# Patient Record
Sex: Male | Born: 1965 | Race: White | Hispanic: No | Marital: Single | Smoking: Current every day smoker
Health system: Southern US, Community
[De-identification: ages and names within clinical notes are randomized; demographics above are authoritative.]

## PROBLEM LIST (undated history)

## (undated) DIAGNOSIS — I10 Essential (primary) hypertension: Secondary | ICD-10-CM

## (undated) DIAGNOSIS — F329 Major depressive disorder, single episode, unspecified: Secondary | ICD-10-CM

## (undated) DIAGNOSIS — F32A Depression, unspecified: Secondary | ICD-10-CM

## (undated) DIAGNOSIS — F419 Anxiety disorder, unspecified: Secondary | ICD-10-CM

## (undated) DIAGNOSIS — Z86711 Personal history of pulmonary embolism: Secondary | ICD-10-CM

## (undated) DIAGNOSIS — M545 Low back pain, unspecified: Secondary | ICD-10-CM

## (undated) HISTORY — PX: TONSILLECTOMY: SUR1361

## (undated) HISTORY — PX: KNEE SURGERY: SHX244

---

## 1998-08-23 ENCOUNTER — Encounter: Payer: Self-pay | Admitting: Orthopedic Surgery

## 1998-08-23 ENCOUNTER — Inpatient Hospital Stay (HOSPITAL_COMMUNITY): Admission: AD | Admit: 1998-08-23 | Discharge: 1998-08-30 | Payer: Self-pay | Admitting: Orthopedic Surgery

## 1999-03-15 ENCOUNTER — Encounter: Payer: Self-pay | Admitting: Orthopedic Surgery

## 1999-03-15 ENCOUNTER — Encounter: Admission: RE | Admit: 1999-03-15 | Discharge: 1999-03-15 | Payer: Self-pay | Admitting: Orthopedic Surgery

## 2004-03-15 ENCOUNTER — Ambulatory Visit: Payer: Self-pay | Admitting: Internal Medicine

## 2004-05-28 ENCOUNTER — Ambulatory Visit: Payer: Self-pay | Admitting: Internal Medicine

## 2004-08-15 ENCOUNTER — Ambulatory Visit: Payer: Self-pay | Admitting: Internal Medicine

## 2004-10-17 ENCOUNTER — Ambulatory Visit: Payer: Self-pay | Admitting: Internal Medicine

## 2004-10-27 ENCOUNTER — Ambulatory Visit (HOSPITAL_COMMUNITY): Admission: RE | Admit: 2004-10-27 | Discharge: 2004-10-27 | Payer: Self-pay | Admitting: Neurosurgery

## 2004-11-15 ENCOUNTER — Ambulatory Visit: Payer: Self-pay | Admitting: Internal Medicine

## 2004-12-05 ENCOUNTER — Ambulatory Visit: Payer: Self-pay | Admitting: Physical Medicine and Rehabilitation

## 2004-12-05 ENCOUNTER — Encounter
Admission: RE | Admit: 2004-12-05 | Discharge: 2005-03-05 | Payer: Self-pay | Admitting: Physical Medicine and Rehabilitation

## 2004-12-26 ENCOUNTER — Emergency Department (HOSPITAL_COMMUNITY): Admission: EM | Admit: 2004-12-26 | Discharge: 2004-12-26 | Payer: Self-pay | Admitting: Emergency Medicine

## 2004-12-27 ENCOUNTER — Ambulatory Visit: Payer: Self-pay | Admitting: Psychiatry

## 2004-12-27 ENCOUNTER — Inpatient Hospital Stay (HOSPITAL_COMMUNITY): Admission: RE | Admit: 2004-12-27 | Discharge: 2004-12-31 | Payer: Self-pay | Admitting: Psychiatry

## 2005-01-01 ENCOUNTER — Ambulatory Visit (HOSPITAL_COMMUNITY): Payer: Self-pay | Admitting: Psychiatry

## 2005-01-08 ENCOUNTER — Ambulatory Visit (HOSPITAL_COMMUNITY): Payer: Self-pay | Admitting: Psychiatry

## 2005-01-20 ENCOUNTER — Ambulatory Visit (HOSPITAL_COMMUNITY): Payer: Self-pay | Admitting: Psychiatry

## 2010-05-23 DIAGNOSIS — J42 Unspecified chronic bronchitis: Secondary | ICD-10-CM | POA: Insufficient documentation

## 2010-05-23 DIAGNOSIS — IMO0001 Reserved for inherently not codable concepts without codable children: Secondary | ICD-10-CM

## 2010-05-23 DIAGNOSIS — M4316 Spondylolisthesis, lumbar region: Secondary | ICD-10-CM

## 2010-05-23 DIAGNOSIS — I10 Essential (primary) hypertension: Secondary | ICD-10-CM | POA: Insufficient documentation

## 2012-07-28 ENCOUNTER — Other Ambulatory Visit: Payer: Self-pay | Admitting: Family Medicine

## 2012-07-28 DIAGNOSIS — M47816 Spondylosis without myelopathy or radiculopathy, lumbar region: Secondary | ICD-10-CM

## 2012-07-28 DIAGNOSIS — H5122 Internuclear ophthalmoplegia, left eye: Secondary | ICD-10-CM

## 2012-07-28 DIAGNOSIS — IMO0002 Reserved for concepts with insufficient information to code with codable children: Secondary | ICD-10-CM

## 2012-08-04 ENCOUNTER — Other Ambulatory Visit: Payer: Self-pay

## 2012-08-05 ENCOUNTER — Ambulatory Visit
Admission: RE | Admit: 2012-08-05 | Discharge: 2012-08-05 | Disposition: A | Discharge status: Home / Self Care | Payer: BC Managed Care – PPO | Source: Ambulatory Visit | Attending: Family Medicine | Admitting: Family Medicine

## 2012-08-05 DIAGNOSIS — IMO0002 Reserved for concepts with insufficient information to code with codable children: Secondary | ICD-10-CM

## 2012-08-05 DIAGNOSIS — H5122 Internuclear ophthalmoplegia, left eye: Secondary | ICD-10-CM

## 2012-08-05 DIAGNOSIS — M47816 Spondylosis without myelopathy or radiculopathy, lumbar region: Secondary | ICD-10-CM

## 2012-10-22 ENCOUNTER — Other Ambulatory Visit: Payer: Self-pay | Admitting: Neurosurgery

## 2012-11-04 ENCOUNTER — Encounter (HOSPITAL_COMMUNITY): Payer: Self-pay

## 2012-11-04 ENCOUNTER — Ambulatory Visit (HOSPITAL_COMMUNITY)
Admission: RE | Admit: 2012-11-04 | Discharge: 2012-11-04 | Disposition: A | Discharge status: Home / Self Care | Payer: BC Managed Care – PPO | Source: Ambulatory Visit | Attending: Anesthesiology | Admitting: Anesthesiology

## 2012-11-04 ENCOUNTER — Encounter (HOSPITAL_COMMUNITY)
Admission: RE | Admit: 2012-11-04 | Discharge: 2012-11-04 | Disposition: A | Discharge status: Home / Self Care | Payer: BC Managed Care – PPO | Source: Ambulatory Visit | Attending: Neurosurgery | Admitting: Neurosurgery

## 2012-11-04 ENCOUNTER — Encounter (HOSPITAL_COMMUNITY): Payer: Self-pay | Admitting: Pharmacy Technician

## 2012-11-04 DIAGNOSIS — Z01818 Encounter for other preprocedural examination: Secondary | ICD-10-CM | POA: Insufficient documentation

## 2012-11-04 DIAGNOSIS — Z01812 Encounter for preprocedural laboratory examination: Secondary | ICD-10-CM | POA: Insufficient documentation

## 2012-11-04 DIAGNOSIS — R9431 Abnormal electrocardiogram [ECG] [EKG]: Secondary | ICD-10-CM | POA: Insufficient documentation

## 2012-11-04 DIAGNOSIS — Z01810 Encounter for preprocedural cardiovascular examination: Secondary | ICD-10-CM | POA: Insufficient documentation

## 2012-11-04 HISTORY — DX: Depression, unspecified: F32.A

## 2012-11-04 HISTORY — DX: Major depressive disorder, single episode, unspecified: F32.9

## 2012-11-04 HISTORY — DX: Anxiety disorder, unspecified: F41.9

## 2012-11-04 HISTORY — DX: Essential (primary) hypertension: I10

## 2012-11-04 HISTORY — DX: Low back pain, unspecified: M54.50

## 2012-11-04 HISTORY — DX: Low back pain: M54.5

## 2012-11-04 LAB — TYPE AND SCREEN: ABO/RH(D): O POS

## 2012-11-04 LAB — CBC
MCH: 33.2 pg (ref 26.0–34.0)
Platelets: 174 10*3/uL (ref 150–400)
RBC: 4.61 MIL/uL (ref 4.22–5.81)
WBC: 7.1 10*3/uL (ref 4.0–10.5)

## 2012-11-04 LAB — BASIC METABOLIC PANEL
Calcium: 9.4 mg/dL (ref 8.4–10.5)
GFR calc non Af Amer: >90 mL/min (ref >90)
Sodium: 141 mEq/L (ref 135–145)

## 2012-11-04 LAB — SURGICAL PCR SCREEN: MRSA, PCR: NEGATIVE

## 2012-11-04 NOTE — Progress Notes (Signed)
Anesthesia Chart Review:  Patient is a 47 year old male scheduled for L5-S1 PLIF on 11/11/12 by Dr. Lovell Sheehan.  History includes smoking, HTN, depression, anxiety, tonsillectomy, 4 knee surgeries.  PCP is listed as Dr. Aida Puffer.  EKG on 11/04/12 showed NSR with sinus arrhythmia, minimal voltage criteria for LVH, may be normal variant, non-specific T wave abnormality.    CXR on 11/04/12 showed no active cardiopulmonary disease.  Preoperative labs noted.  Preoperative diagnostic studies appear acceptable for OR.  Further evaluation by his assigned anesthesiologist on the day of surgery.  Anthony Berg [facility]/Anesthesiology Phone [phone] 11/04/2012 5:02 PM

## 2012-11-04 NOTE — Pre-Procedure Instructions (Signed)
SCOTTIE METAYER  11/04/2012   Your procedure is scheduled on:  Thursday, October 2  Report to [facility]  2 * 3 at 0630 AM.  Call this number if you have problems the morning of surgery: [phone]   Remember:   Do not eat food or drink liquids after midnight Wednesday night.   Take these medicines the morning of surgery with A SIP OF WATER: none   Do not wear jewelry, make-up or nail polish.  Do not wear lotions, powders, or perfumes. Do not wear deodorant.  Do not shave 48 hours prior to surgery. Men may shave face and neck.  Do not bring valuables to the hospital.  [facility] is not responsible    for any belongings or valuables.               Contacts, dentures or bridgework may not be worn into surgery.  Leave suitcase in the car. After surgery it may be brought to your room.  For patients admitted to the hospital, discharge time is determined by your  treatment team.               Special Instructions: Shower using CHG 2 nights before surgery and the night before surgery.  If you shower the day of surgery use CHG.  Use special wash - you have one bottle of CHG for all showers.  You should use approximately 1/3 of the bottle for each shower.   Please read over the following fact sheets that you were given: Pain Booklet, Coughing and Deep Breathing, Blood Transfusion Information and Surgical Site Infection Prevention

## 2012-11-10 MED ORDER — CEFAZOLIN SODIUM-DEXTROSE 2-3 GM-% IV SOLR
2.0000 g | INTRAVENOUS | Status: AC
  Administered 2012-11-11: 2 g via INTRAVENOUS
  Filled 2012-11-10: qty 50

## 2012-11-11 ENCOUNTER — Encounter (HOSPITAL_COMMUNITY): Payer: Self-pay | Admitting: Vascular Surgery

## 2012-11-11 ENCOUNTER — Encounter (HOSPITAL_COMMUNITY)
Admission: RE | Disposition: A | Discharge status: Home / Self Care | Payer: BC Managed Care – PPO | Source: Ambulatory Visit | Attending: Neurosurgery

## 2012-11-11 ENCOUNTER — Inpatient Hospital Stay (HOSPITAL_COMMUNITY): Payer: BC Managed Care – PPO | Admitting: Anesthesiology

## 2012-11-11 ENCOUNTER — Inpatient Hospital Stay (HOSPITAL_COMMUNITY): Payer: BC Managed Care – PPO

## 2012-11-11 ENCOUNTER — Inpatient Hospital Stay (HOSPITAL_COMMUNITY)
Admission: RE | Admit: 2012-11-11 | Discharge: 2012-11-14 | DRG: 756 | Disposition: A | Discharge status: Home / Self Care | Payer: BC Managed Care – PPO | Source: Ambulatory Visit | Attending: Neurosurgery | Admitting: Neurosurgery

## 2012-11-11 ENCOUNTER — Encounter (HOSPITAL_COMMUNITY): Payer: Self-pay | Admitting: *Deleted

## 2012-11-11 DIAGNOSIS — M5137 Other intervertebral disc degeneration, lumbosacral region: Secondary | ICD-10-CM | POA: Diagnosis present

## 2012-11-11 DIAGNOSIS — F411 Generalized anxiety disorder: Secondary | ICD-10-CM | POA: Diagnosis present

## 2012-11-11 DIAGNOSIS — F17200 Nicotine dependence, unspecified, uncomplicated: Secondary | ICD-10-CM | POA: Diagnosis present

## 2012-11-11 DIAGNOSIS — M4310 Spondylolisthesis, site unspecified: Principal | ICD-10-CM | POA: Diagnosis present

## 2012-11-11 DIAGNOSIS — F329 Major depressive disorder, single episode, unspecified: Secondary | ICD-10-CM | POA: Diagnosis present

## 2012-11-11 DIAGNOSIS — G8929 Other chronic pain: Secondary | ICD-10-CM | POA: Diagnosis present

## 2012-11-11 DIAGNOSIS — M51379 Other intervertebral disc degeneration, lumbosacral region without mention of lumbar back pain or lower extremity pain: Secondary | ICD-10-CM | POA: Diagnosis present

## 2012-11-11 DIAGNOSIS — I10 Essential (primary) hypertension: Secondary | ICD-10-CM | POA: Diagnosis present

## 2012-11-11 DIAGNOSIS — M48061 Spinal stenosis, lumbar region without neurogenic claudication: Secondary | ICD-10-CM | POA: Diagnosis present

## 2012-11-11 DIAGNOSIS — F3289 Other specified depressive episodes: Secondary | ICD-10-CM | POA: Diagnosis present

## 2012-11-11 HISTORY — PX: LUMBAR FUSION: SHX111

## 2012-11-11 SURGERY — POSTERIOR LUMBAR FUSION 1 LEVEL
Anesthesia: General | Wound class: Clean

## 2012-11-11 MED ORDER — FENTANYL CITRATE 0.05 MG/ML IJ SOLN
INTRAMUSCULAR | Status: DC | PRN
  Administered 2012-11-11: 100 ug via INTRAVENOUS
  Administered 2012-11-11: 50 ug via INTRAVENOUS
  Administered 2012-11-11 (×3): 100 ug via INTRAVENOUS
  Administered 2012-11-11 (×3): 50 ug via INTRAVENOUS

## 2012-11-11 MED ORDER — HYDROMORPHONE HCL PF 1 MG/ML IJ SOLN
0.2500 mg | INTRAMUSCULAR | Status: DC | PRN
Start: 2012-11-11 — End: 2012-11-11
  Administered 2012-11-11 (×4): 0.5 mg via INTRAVENOUS

## 2012-11-11 MED ORDER — SURGIFOAM 100 EX MISC
CUTANEOUS | Status: DC | PRN
  Administered 2012-11-11: 08:00:00 via TOPICAL

## 2012-11-11 MED ORDER — LIDOCAINE HCL (CARDIAC) 20 MG/ML IV SOLN
INTRAVENOUS | Status: DC | PRN
  Administered 2012-11-11: 100 mg via INTRAVENOUS

## 2012-11-11 MED ORDER — PROPOFOL 10 MG/ML IV BOLUS
INTRAVENOUS | Status: DC | PRN
  Administered 2012-11-11: 200 mg via INTRAVENOUS

## 2012-11-11 MED ORDER — LABETALOL HCL 5 MG/ML IV SOLN
INTRAVENOUS | Status: DC | PRN
  Administered 2012-11-11 (×4): 5 mg via INTRAVENOUS

## 2012-11-11 MED ORDER — OXYCODONE HCL 5 MG/5ML PO SOLN: 5.0000 mg | Freq: Once | ORAL | Status: AC | PRN

## 2012-11-11 MED ORDER — PHENOL 1.4 % MT LIQD: 1.0000 | OROMUCOSAL | Status: DC | PRN

## 2012-11-11 MED ORDER — NALOXONE HCL 0.4 MG/ML IJ SOLN: 0.4000 mg | INTRAMUSCULAR | Status: DC | PRN

## 2012-11-11 MED ORDER — HYDROMORPHONE HCL PF 1 MG/ML IJ SOLN
INTRAMUSCULAR | Status: AC
  Filled 2012-11-11: qty 1

## 2012-11-11 MED ORDER — MENTHOL 3 MG MT LOZG: 1.0000 | LOZENGE | OROMUCOSAL | Status: DC | PRN

## 2012-11-11 MED ORDER — MIDAZOLAM HCL 5 MG/5ML IJ SOLN
INTRAMUSCULAR | Status: DC | PRN
  Administered 2012-11-11: 2 mg via INTRAVENOUS

## 2012-11-11 MED ORDER — GLYCOPYRROLATE 0.2 MG/ML IJ SOLN
INTRAMUSCULAR | Status: DC | PRN
  Administered 2012-11-11: .7 mg via INTRAVENOUS

## 2012-11-11 MED ORDER — BUPROPION HCL 75 MG PO TABS
75.0000 mg | ORAL_TABLET | Freq: Two times a day (BID) | ORAL | Status: DC
  Administered 2012-11-11 – 2012-11-14 (×5): 75 mg via ORAL
  Filled 2012-11-11 (×8): qty 1

## 2012-11-11 MED ORDER — DIAZEPAM 5 MG PO TABS
ORAL_TABLET | ORAL | Status: AC
  Filled 2012-11-11: qty 1

## 2012-11-11 MED ORDER — ONDANSETRON HCL 4 MG/2ML IJ SOLN: 4.0000 mg | Freq: Four times a day (QID) | INTRAMUSCULAR | Status: DC | PRN

## 2012-11-11 MED ORDER — LACTATED RINGERS IV SOLN
INTRAVENOUS | Status: DC | PRN
  Administered 2012-11-11 (×2): via INTRAVENOUS

## 2012-11-11 MED ORDER — OXYCODONE-ACETAMINOPHEN 5-325 MG PO TABS
1.0000 | ORAL_TABLET | ORAL | Status: DC | PRN
  Administered 2012-11-11 – 2012-11-13 (×4): 2 via ORAL
  Administered 2012-11-13: 1 via ORAL
  Administered 2012-11-14 (×3): 2 via ORAL
  Filled 2012-11-11 (×5): qty 2
  Filled 2012-11-11: qty 1
  Filled 2012-11-11 (×3): qty 2

## 2012-11-11 MED ORDER — CEFAZOLIN SODIUM-DEXTROSE 2-3 GM-% IV SOLR
2.0000 g | Freq: Three times a day (TID) | INTRAVENOUS | Status: AC
  Administered 2012-11-11 (×2): 2 g via INTRAVENOUS
  Filled 2012-11-11 (×2): qty 50

## 2012-11-11 MED ORDER — ONDANSETRON HCL 4 MG/2ML IJ SOLN
4.0000 mg | INTRAMUSCULAR | Status: DC | PRN
  Administered 2012-11-13: 4 mg via INTRAVENOUS
  Filled 2012-11-11: qty 2

## 2012-11-11 MED ORDER — DEXAMETHASONE SODIUM PHOSPHATE 4 MG/ML IJ SOLN
INTRAMUSCULAR | Status: DC | PRN
  Administered 2012-11-11: 8 mg via INTRAVENOUS

## 2012-11-11 MED ORDER — DOCUSATE SODIUM 100 MG PO CAPS
100.0000 mg | ORAL_CAPSULE | Freq: Two times a day (BID) | ORAL | Status: DC
  Administered 2012-11-12 – 2012-11-14 (×4): 100 mg via ORAL
  Filled 2012-11-11 (×4): qty 1

## 2012-11-11 MED ORDER — LACTATED RINGERS IV SOLN
INTRAVENOUS | Status: DC
  Administered 2012-11-11 – 2012-11-13 (×4): via INTRAVENOUS

## 2012-11-11 MED ORDER — BUPIVACAINE-EPINEPHRINE PF 0.5-1:200000 % IJ SOLN
INTRAMUSCULAR | Status: DC | PRN
  Administered 2012-11-11: 10 mL

## 2012-11-11 MED ORDER — MORPHINE SULFATE (PF) 1 MG/ML IV SOLN
INTRAVENOUS | Status: DC
  Administered 2012-11-11: 25 mg via INTRAVENOUS
  Administered 2012-11-11: 15 mg via INTRAVENOUS
  Administered 2012-11-11: 12:00:00 via INTRAVENOUS
  Administered 2012-11-11: 10.9 mg via INTRAVENOUS
  Administered 2012-11-11: 18 mg via INTRAVENOUS
  Administered 2012-11-12 (×2): via INTRAVENOUS
  Administered 2012-11-12: 16.5 mg via INTRAVENOUS
  Administered 2012-11-12: 9 mg via INTRAVENOUS
  Administered 2012-11-12: 13.5 mg via INTRAVENOUS
  Administered 2012-11-12: 21 mg via INTRAVENOUS
  Administered 2012-11-12: 8.94 mg via INTRAVENOUS
  Administered 2012-11-12: 25.5 mg via INTRAVENOUS
  Administered 2012-11-13: 9 mg via INTRAVENOUS
  Administered 2012-11-13: 13.5 mg via INTRAVENOUS
  Administered 2012-11-13: 3.98 mg via INTRAVENOUS
  Administered 2012-11-13: 17.7 mg via INTRAVENOUS
  Filled 2012-11-11 (×8): qty 25

## 2012-11-11 MED ORDER — HYDROMORPHONE HCL PF 1 MG/ML IJ SOLN
INTRAMUSCULAR | Status: DC | PRN
  Administered 2012-11-11 (×2): 0.5 mg via INTRAVENOUS

## 2012-11-11 MED ORDER — NEOSTIGMINE METHYLSULFATE 1 MG/ML IJ SOLN
INTRAMUSCULAR | Status: DC | PRN
  Administered 2012-11-11: 4 mg via INTRAVENOUS

## 2012-11-11 MED ORDER — BACITRACIN ZINC 500 UNIT/GM EX OINT
TOPICAL_OINTMENT | CUTANEOUS | Status: DC | PRN
  Administered 2012-11-11: 1 via TOPICAL

## 2012-11-11 MED ORDER — ALBUTEROL SULFATE HFA 108 (90 BASE) MCG/ACT IN AERS
INHALATION_SPRAY | RESPIRATORY_TRACT | Status: DC | PRN
  Administered 2012-11-11: 2 via RESPIRATORY_TRACT

## 2012-11-11 MED ORDER — CARISOPRODOL 350 MG PO TABS
350.0000 mg | ORAL_TABLET | Freq: Two times a day (BID) | ORAL | Status: DC
  Administered 2012-11-11 – 2012-11-14 (×6): 350 mg via ORAL
  Filled 2012-11-11 (×6): qty 1

## 2012-11-11 MED ORDER — DIAZEPAM 5 MG PO TABS
5.0000 mg | ORAL_TABLET | Freq: Four times a day (QID) | ORAL | Status: DC | PRN
  Administered 2012-11-11 – 2012-11-14 (×8): 5 mg via ORAL
  Filled 2012-11-11 (×8): qty 1

## 2012-11-11 MED ORDER — ROCURONIUM BROMIDE 100 MG/10ML IV SOLN
INTRAVENOUS | Status: DC | PRN
  Administered 2012-11-11: 50 mg via INTRAVENOUS
  Administered 2012-11-11: 30 mg via INTRAVENOUS
  Administered 2012-11-11: 20 mg via INTRAVENOUS

## 2012-11-11 MED ORDER — SODIUM CHLORIDE 0.9 % IJ SOLN: 9.0000 mL | INTRAMUSCULAR | Status: DC | PRN

## 2012-11-11 MED ORDER — DIPHENHYDRAMINE HCL 50 MG/ML IJ SOLN
12.5000 mg | Freq: Four times a day (QID) | INTRAMUSCULAR | Status: DC | PRN
  Administered 2012-11-11: 12.5 mg via INTRAVENOUS
  Filled 2012-11-11: qty 1

## 2012-11-11 MED ORDER — VECURONIUM BROMIDE 10 MG IV SOLR
INTRAVENOUS | Status: DC | PRN
  Administered 2012-11-11 (×2): 3 mg via INTRAVENOUS

## 2012-11-11 MED ORDER — 0.9 % SODIUM CHLORIDE (POUR BTL) OPTIME
TOPICAL | Status: DC | PRN
  Administered 2012-11-11: 1000 mL

## 2012-11-11 MED ORDER — ZOLPIDEM TARTRATE 5 MG PO TABS
5.0000 mg | ORAL_TABLET | Freq: Every evening | ORAL | Status: DC | PRN
  Administered 2012-11-12: 5 mg via ORAL
  Filled 2012-11-11: qty 1

## 2012-11-11 MED ORDER — OXYCODONE HCL 5 MG PO TABS
ORAL_TABLET | ORAL | Status: AC
  Filled 2012-11-11: qty 1

## 2012-11-11 MED ORDER — DIPHENHYDRAMINE HCL 12.5 MG/5ML PO ELIX: 12.5000 mg | ORAL_SOLUTION | Freq: Four times a day (QID) | ORAL | Status: DC | PRN

## 2012-11-11 MED ORDER — ONDANSETRON HCL 4 MG/2ML IJ SOLN
INTRAMUSCULAR | Status: DC | PRN
  Administered 2012-11-11: 4 mg via INTRAVENOUS

## 2012-11-11 MED ORDER — ACETAMINOPHEN 325 MG PO TABS
650.0000 mg | ORAL_TABLET | ORAL | Status: DC | PRN
  Administered 2012-11-12 – 2012-11-13 (×2): 650 mg via ORAL
  Filled 2012-11-11 (×2): qty 2

## 2012-11-11 MED ORDER — OXYCODONE HCL 5 MG PO TABS
5.0000 mg | ORAL_TABLET | Freq: Once | ORAL | Status: AC | PRN
  Administered 2012-11-11: 5 mg via ORAL

## 2012-11-11 MED ORDER — BUPIVACAINE LIPOSOME 1.3 % IJ SUSP
INTRAMUSCULAR | Status: DC | PRN
  Administered 2012-11-11: 20 mL

## 2012-11-11 MED ORDER — ACETAMINOPHEN 650 MG RE SUPP: 650.0000 mg | RECTAL | Status: DC | PRN

## 2012-11-11 MED ORDER — ALUM & MAG HYDROXIDE-SIMETH 200-200-20 MG/5ML PO SUSP: 30.0000 mL | Freq: Four times a day (QID) | ORAL | Status: DC | PRN

## 2012-11-11 MED ORDER — METOCLOPRAMIDE HCL 5 MG/ML IJ SOLN: 10.0000 mg | Freq: Once | INTRAMUSCULAR | Status: DC | PRN

## 2012-11-11 MED ORDER — BUPIVACAINE LIPOSOME 1.3 % IJ SUSP
20.0000 mL | INTRAMUSCULAR | Status: DC
  Filled 2012-11-11: qty 20

## 2012-11-11 MED ORDER — MORPHINE SULFATE 2 MG/ML IJ SOLN: 1.0000 mg | INTRAMUSCULAR | Status: DC | PRN

## 2012-11-11 MED ORDER — HYDROCODONE-ACETAMINOPHEN 5-325 MG PO TABS
1.0000 | ORAL_TABLET | ORAL | Status: DC | PRN
  Administered 2012-11-13 (×2): 2 via ORAL
  Filled 2012-11-11 (×2): qty 2

## 2012-11-11 MED ORDER — SODIUM CHLORIDE 0.9 % IR SOLN
Status: DC | PRN
  Administered 2012-11-11: 08:00:00

## 2012-11-11 MED ORDER — MORPHINE SULFATE (PF) 1 MG/ML IV SOLN
INTRAVENOUS | Status: AC
  Filled 2012-11-11: qty 25

## 2012-11-11 MED ORDER — BISOPROLOL-HYDROCHLOROTHIAZIDE 2.5-6.25 MG PO TABS
1.0000 | ORAL_TABLET | Freq: Every day | ORAL | Status: DC
  Administered 2012-11-11 – 2012-11-14 (×4): 1 via ORAL
  Filled 2012-11-11 (×4): qty 1

## 2012-11-11 SURGICAL SUPPLY — 69 items
BAG DECANTER FOR FLEXI CONT (MISCELLANEOUS) ×2 IMPLANT
BENZOIN TINCTURE PRP APPL 2/3 (GAUZE/BANDAGES/DRESSINGS) ×2 IMPLANT
BLADE SURG ROTATE 9660 (MISCELLANEOUS) ×2 IMPLANT
BRUSH SCRUB EZ PLAIN DRY (MISCELLANEOUS) ×2 IMPLANT
BUR ACORN 6.0 (BURR) ×2 IMPLANT
BUR MATCHSTICK NEURO 3.0 LAGG (BURR) ×2 IMPLANT
CANISTER SUCTION 2500CC (MISCELLANEOUS) ×2 IMPLANT
CAP REVERE LOCKING (Cap) ×8 IMPLANT
CLOTH BEACON ORANGE TIMEOUT ST (SAFETY) ×2 IMPLANT
CONT SPEC 4OZ CLIKSEAL STRL BL (MISCELLANEOUS) ×2 IMPLANT
COVER BACK TABLE 24X17X13 BIG (DRAPES) IMPLANT
COVER TABLE BACK 60X90 (DRAPES) ×2 IMPLANT
DRAPE C-ARM 42X72 X-RAY (DRAPES) ×4 IMPLANT
DRAPE LAPAROTOMY 100X72X124 (DRAPES) ×2 IMPLANT
DRAPE POUCH INSTRU U-SHP 10X18 (DRAPES) ×2 IMPLANT
DRAPE PROXIMA HALF (DRAPES) ×2 IMPLANT
DRAPE SURG 17X23 STRL (DRAPES) ×8 IMPLANT
ELECT BLADE 4.0 EZ CLEAN MEGAD (MISCELLANEOUS) ×2
ELECT REM PT RETURN 9FT ADLT (ELECTROSURGICAL) ×2
ELECTRODE BLDE 4.0 EZ CLN MEGD (MISCELLANEOUS) ×1 IMPLANT
ELECTRODE REM PT RTRN 9FT ADLT (ELECTROSURGICAL) ×1 IMPLANT
EVACUATOR 1/8 PVC DRAIN (DRAIN) ×2 IMPLANT
GAUZE SPONGE 4X4 16PLY XRAY LF (GAUZE/BANDAGES/DRESSINGS) ×2 IMPLANT
GLOVE BIO SURGEON STRL SZ8.5 (GLOVE) ×4 IMPLANT
GLOVE BIOGEL PI IND STRL 7.0 (GLOVE) ×1 IMPLANT
GLOVE BIOGEL PI INDICATOR 7.0 (GLOVE) ×1
GLOVE ECLIPSE 8.0 STRL XLNG CF (GLOVE) ×2 IMPLANT
GLOVE EXAM NITRILE LRG STRL (GLOVE) IMPLANT
GLOVE EXAM NITRILE MD LF STRL (GLOVE) ×2 IMPLANT
GLOVE EXAM NITRILE XL STR (GLOVE) IMPLANT
GLOVE EXAM NITRILE XS STR PU (GLOVE) IMPLANT
GLOVE SS BIOGEL STRL SZ 6.5 (GLOVE) ×3 IMPLANT
GLOVE SS BIOGEL STRL SZ 8 (GLOVE) ×2 IMPLANT
GLOVE SUPERSENSE BIOGEL SZ 6.5 (GLOVE) ×3
GLOVE SUPERSENSE BIOGEL SZ 8 (GLOVE) ×2
GOWN BRE IMP SLV AUR LG STRL (GOWN DISPOSABLE) ×4 IMPLANT
GOWN BRE IMP SLV AUR XL STRL (GOWN DISPOSABLE) ×4 IMPLANT
GOWN STRL REIN 2XL LVL4 (GOWN DISPOSABLE) IMPLANT
GRANULES ACTIFUSE 10ML (Putty) ×2 IMPLANT
KIT BASIN OR (CUSTOM PROCEDURE TRAY) ×2 IMPLANT
KIT ROOM TURNOVER OR (KITS) ×2 IMPLANT
MILL MEDIUM DISP (BLADE) ×2 IMPLANT
NEEDLE HYPO 21X1.5 SAFETY (NEEDLE) ×2 IMPLANT
NEEDLE HYPO 22GX1.5 SAFETY (NEEDLE) ×2 IMPLANT
NS IRRIG 1000ML POUR BTL (IV SOLUTION) ×2 IMPLANT
PACK FOAM VITOSS 10CC (Orthopedic Implant) ×2 IMPLANT
PACK LAMINECTOMY NEURO (CUSTOM PROCEDURE TRAY) ×2 IMPLANT
PAD ARMBOARD 7.5X6 YLW CONV (MISCELLANEOUS) ×6 IMPLANT
PATTIES SURGICAL .5 X1 (DISPOSABLE) IMPLANT
ROD REVERE 6.35 40MM (Rod) ×4 IMPLANT
SCREW 7.5X45MM (Screw) ×4 IMPLANT
SCREW REVERE 6.35 7.5X40 (Screw) ×4 IMPLANT
SPACER SUSTAIN O 10X26 12MM (Spacer) ×4 IMPLANT
SPONGE GAUZE 4X4 12PLY (GAUZE/BANDAGES/DRESSINGS) ×2 IMPLANT
SPONGE LAP 4X18 X RAY DECT (DISPOSABLE) IMPLANT
SPONGE NEURO XRAY DETECT 1X3 (DISPOSABLE) IMPLANT
SPONGE SURGIFOAM ABS GEL 100 (HEMOSTASIS) ×2 IMPLANT
STRIP CLOSURE SKIN 1/2X4 (GAUZE/BANDAGES/DRESSINGS) ×2 IMPLANT
SUT VIC AB 1 CT1 18XBRD ANBCTR (SUTURE) ×2 IMPLANT
SUT VIC AB 1 CT1 8-18 (SUTURE) ×2
SUT VIC AB 2-0 CP2 18 (SUTURE) ×4 IMPLANT
SYR 20CC LL (SYRINGE) ×2 IMPLANT
SYR 20ML ECCENTRIC (SYRINGE) ×2 IMPLANT
TAPE CLOTH SURG 4X10 WHT LF (GAUZE/BANDAGES/DRESSINGS) ×2 IMPLANT
TOWEL OR 17X24 6PK STRL BLUE (TOWEL DISPOSABLE) ×2 IMPLANT
TOWEL OR 17X26 10 PK STRL BLUE (TOWEL DISPOSABLE) ×2 IMPLANT
TRAY FOLEY CATH 14FRSI W/METER (CATHETERS) ×2 IMPLANT
TRAY FOLEY CATH 16FRSI W/METER (SET/KITS/TRAYS/PACK) ×2 IMPLANT
WATER STERILE IRR 1000ML POUR (IV SOLUTION) ×2 IMPLANT

## 2012-11-11 NOTE — Op Note (Signed)
Brief history: The patient is a 47 year old white male who has complained of chronic back and leg pain. He has failed medical management and was worked up with a lumbar MRI. This demonstrated a grade 1 spondylolisthesis at L5-S1 with foraminal stenosis. I discussed the various treatment option with the patient including surgery. The patient has weighed the risks, benefits, and alternatives surgery and decided proceed with an L5-S1 decompression, instrumentation, and fusion.  Preoperative diagnosis: L5-S1 spondylolisthesis, Degenerative disc disease, spinal stenosis compressing both the L5 and the S1 nerve roots; lumbago; lumbar radiculopathy  Postoperative diagnosis: The same  Procedure: L5 Gill procedure/L5 laminectomy /foraminotomies to decompress the bilateral L5 and S1 nerve roots(the work required to do this was in addition to the work required to do the posterior lumbar interbody fusion because of the patient's spinal stenosis, facet arthropathy. Etc. requiring a wide decompression of the nerve roots.); L5 S1 posterior lumbar interbody fusion with local morselized autograft bone and Actifusebone graft extender; insertion of interbody prosthesis at L5-S1 (globus peek interbody prosthesis); posterior nonsegmental instrumentation from L5 to S1 with globus titanium pedicle screws and rods; posterior lateral arthrodesis at L5-S1 with local morselized autograft bone and Vitoss bone graft extender.  Surgeon: Dr. Delma Officer  Asst.: Dr. Aliene Beams  Anesthesia: Gen. endotracheal  Estimated blood loss: 200 cc  Drains: One medium Hemovac  Complications: None  Description of procedure: The patient was brought to the operating room by the anesthesia team. General endotracheal anesthesia was induced. The patient was turned to the prone position on the [place]. The patient's lumbosacral region was then prepared with Betadine scrub and Betadine solution. Sterile drapes were applied.  I then  injected the area to be incised with Marcaine with epinephrine solution. I then used the scalpel to make a linear midline incision over the L5-S1 interspace. I then used electrocautery to perform a bilateral subperiosteal dissection exposing the spinous process and lamina of L4-L5 and the upper sacrum. We then obtained intraoperative radiograph to confirm our location. We then inserted the Verstrac retractor to provide exposure.  I began the decompression by incising the interspinous ligament at L4-5 and L5-S1. I then used a Kerrison punch and Leksell nodule were to remove the L5 spinous process and performed at L5 Gill procedure worked removal of the L5 lamina and superior facets. We then used the Kerrison punches to widen the laminotomy and removed the ligamentum flavum at L4-5 and L5-S1. We used the Kerrison punches to remove the medial facets at L5-S1. We performed wide foraminotomies about the bilateral L5 and S1 nerve roots completing the decompression.  We now turned our attention to the posterior lumbar interbody fusion. I used a scalpel to incise the intervertebral disc at L5-S1. I then performed a partial intervertebral discectomy at L5 S1 using the pituitary forceps. We prepared the vertebral endplates at L5-S1 for the fusion by removing the soft tissues with the curettes. We then used the trial spacers to pick the appropriate sized interbody prosthesis. We prefilled his prosthesis with a combination of local morselized autograft bone that we obtained during the decompression as well as Actifuse bone graft extender. We inserted the prefilled prosthesis into the interspace at L5-S1. There was a good snug fit of the prosthesis in the interspace. We then filled and the remainder of the intervertebral disc space with local morselized autograft bone and Actifuse. This completed the posterior lumbar interbody arthrodesis.  We now turned attention to the instrumentation. Under fluoroscopic guidance we  cannulated the  bilateral L5 and S1 pedicles with the bone probe. We then removed the bone probe. We then tapped the pedicle with a 6.5 millimeter tap. We then removed the tap. We probed inside the tapped pedicle with a ball probe to rule out cortical breaches. We then inserted a 7.5 x 40 and 45 millimeter pedicle screw into the L5 and S1 pedicles bilaterally under fluoroscopic guidance. We then palpated along the medial aspect of the pedicles to rule out cortical breaches. There were none. The nerve roots were not injured. We then connected the unilateral pedicle screws with a lordotic rod. We compressed the construct and secured the rod in place with the caps. We then tightened the caps appropriately. This completed the instrumentation from L5-S1.  We now turned our attention to the posterior lateral arthrodesis at L5-S1. We used the high-speed drill to decorticate the remainder of the facets, pars, transverse process at L5-S1. We then applied a combination of local morselized autograft bone and Vitoss bone graft extender over these decorticated posterior lateral structures. This completed the posterior lateral arthrodesis.  We then obtained hemostasis using bipolar electrocautery. We irrigated the wound out with bacitracin solution. We inspected the thecal sac and nerve roots and noted they were well decompressed. We then removed the retractor. We placed a medium Hemovac drain in the epidural space and tunneled out through separate stab wound. We reapproximated patient's thoracolumbar fascia with interrupted #1 Vicryl suture. We reapproximated patient's subcutaneous tissue with interrupted 2-0 Vicryl suture. The reapproximated patient's skin with Steri-Strips and benzoin. The wound was then coated with bacitracin ointment. A sterile dressing was applied. The drapes were removed. The patient was subsequently returned to the supine position where they were extubated by the anesthesia team. He was then  transported to the post anesthesia care unit in stable condition. All sponge instrument and needle counts were reportedly correct at the end of this case.

## 2012-11-11 NOTE — Progress Notes (Signed)
UR COMPLETED  

## 2012-11-11 NOTE — Progress Notes (Signed)
Orthopedic Tech Progress Note Patient Details:  [name] Nov 21, 1965 [id number]  Patient ID: Anthony Berg, male   DOB: 10/25/1965, 47 y.o.   MRN: [id number]  Called in bio-tech brace order; spoke with Anderson Malta, Anthony Berg 11/11/2012, 2:04 PM

## 2012-11-11 NOTE — Preoperative (Signed)
Beta Blockers   Reason not to administer Beta Blockers:Not Applicable 

## 2012-11-11 NOTE — Progress Notes (Signed)
Patient ID: YAHEL FUSTON, male   DOB: Mar 23, 1965, 47 y.o.   MRN: [id number] Subjective:  The patient is alert and pleasant. He looks well. He is in no apparent distress.  Objective: Vital signs in last 24 hours: Temp:  [97.2 F (36.2 C)] 97.2 F (36.2 C) (10/02 0631) Pulse Rate:  [71] 71 (10/02 0631) Resp:  [18] 18 (10/02 0631) BP: (145)/(99) 145/99 mmHg (10/02 0631) SpO2:  [98 %] 98 % (10/02 0631)  Intake/Output from previous day:   Intake/Output this shift: Total I/O In: 1500 [I.V.:1500] Out: 510 [Urine:310; Blood:200]  Physical exam patient is alert and oriented. His lower extremity strength is normal.  Lab Results: No results found for this basename: WBC, HGB, HCT, PLT,  in the last 72 hours BMET No results found for this basename: NA, K, CL, CO2, GLUCOSE, BUN, CREATININE, CALCIUM,  in the last 72 hours  Studies/Results: No results found.  Assessment/Plan: The patient is doing well.  LOS: 0 days     Rosina Cressler,Fontaine D 11/11/2012, 11:43 AM

## 2012-11-11 NOTE — Anesthesia Postprocedure Evaluation (Signed)
Anesthesia Post Note  Patient: Anthony Berg  Procedure(s) Performed: Procedure(s) (LRB): POSTERIOR LUMBAR FUSION 1 LEVEL (N/A)  Anesthesia type: General  Patient location: PACU  Post pain: Pain level controlled  Post assessment: Patient's Cardiovascular Status Stable  Last Vitals:  Filed Vitals:   11/11/12 1245  BP:   Pulse: 93  Temp:   Resp: 12    Post vital signs: Reviewed and stable  Level of consciousness: alert  Complications: No apparent anesthesia complications

## 2012-11-11 NOTE — H&P (Signed)
Subjective: The patient is a 47 year old white male who has had chronic back and leg pain. He has failed medical management and was worked up with a lumbar MRI. This demonstrated an L5-S1 spondylolisthesis with foraminal stenosis. I discussed the various treatment options with the patient including surgery. He has weighed the risks, benefits, and alternatives surgery and decided proceed with at L5-S1 decompression, instrumentation, and fusion.   Past Medical History  Diagnosis Date  . Hypertension   . Depression   . Low back pain potentially associated with radiculopathy   . Anxiety     Past Surgical History  Procedure Laterality Date  . Knee surgery  2000-2002    hx of 4 knee surgeries  . Tonsillectomy      No Known Allergies  History  Substance Use Topics  . Smoking status: Current Every Day Smoker -- 1.00 packs/day for 8 years    Types: Cigarettes  . Smokeless tobacco: Never Used     Comment: smoking cessation discussed  . Alcohol Use: 0.6 oz/week    1 Glasses of wine, 4-5 Cans of beer per week    Family History  Problem Relation Age of Onset  . Heart disease Father   . Heart disease Brother    Prior to Admission medications   Medication Sig Start Date End Date Taking? Authorizing Provider  bisoprolol-hydrochlorothiazide (ZIAC) 2.5-6.25 MG per tablet Take 1 tablet by mouth daily.   Yes Historical Provider, MD  buPROPion (WELLBUTRIN) 75 MG tablet Take 75 mg by mouth 2 (two) times daily.   Yes Historical Provider, MD  carisoprodol (SOMA) 350 MG tablet Take 350 mg by mouth 2 (two) times daily.   Yes Historical Provider, MD  HYDROcodone-acetaminophen (NORCO) 10-325 MG per tablet Take 1 tablet by mouth every 6 (six) hours as needed for pain.   Yes Historical Provider, MD     Review of Systems  Positive ROS: As above  All other systems have been reviewed and were otherwise negative with the exception of those mentioned in the HPI and as above.  Objective: Vital signs in  last 24 hours: Temp:  [97.2 F (36.2 C)] 97.2 F (36.2 C) (10/02 0631) Pulse Rate:  [71] 71 (10/02 0631) Resp:  [18] 18 (10/02 0631) BP: (145)/(99) 145/99 mmHg (10/02 0631) SpO2:  [98 %] 98 % (10/02 0631)  General Appearance: Alert, cooperative, no distress, appears stated age Head: Normocephalic, without obvious abnormality, atraumatic Eyes: PERRL, conjunctiva/corneas clear, EOM's intact, fundi benign, both eyes      Ears: Normal TM's and external ear canals, both ears Throat: Lips, mucosa, and tongue normal; teeth and gums normal Neck: Supple, symmetrical, trachea midline, no adenopathy; thyroid: No enlargement/tenderness/nodules; no carotid bruit or JVD Back: Symmetric, no curvature, ROM normal, no CVA tenderness Lungs: Clear to auscultation bilaterally, respirations unlabored Heart: Regular rate and rhythm, S1 and S2 normal, no murmur, rub or gallop Abdomen: Soft, non-tender, bowel sounds active all four quadrants, no masses, no organomegaly Extremities: Extremities normal, atraumatic, no cyanosis or edema Pulses: 2+ and symmetric all extremities Skin: Skin color, texture, turgor normal, no rashes or lesions  NEUROLOGIC:   Mental status: alert and oriented, no aphasia, good attention span, Fund of knowledge/ memory ok Motor Exam - grossly normal Sensory Exam - grossly normal Reflexes:  Coordination - grossly normal Gait - grossly normal Balance - grossly normal Cranial Nerves: I: smell Not tested  II: visual acuity  OS: Normal    OD: Normal   II: visual fields Full to  confrontation  II: pupils Equal, round, reactive to light  III,VII: ptosis None  III,IV,VI: extraocular muscles  Full ROM  V: mastication Normal  V: facial light touch sensation  Normal  V,VII: corneal reflex  Present  VII: facial muscle function - upper  Normal  VII: facial muscle function - lower Normal  VIII: hearing Not tested  IX: soft palate elevation  Normal  IX,X: gag reflex Present  XI:  trapezius strength  5/5  XI: sternocleidomastoid strength 5/5  XI: neck flexion strength  5/5  XII: tongue strength  Normal    Data Review Lab Results  Component Value Date   WBC 7.1 11/04/2012   HGB 15.3 11/04/2012   HCT 45.3 11/04/2012   MCV 98.3 11/04/2012   PLT 174 11/04/2012   Lab Results  Component Value Date   NA 141 11/04/2012   K 5.0 11/04/2012   CL 104 11/04/2012   CO2 28 11/04/2012   BUN 15 11/04/2012   CREATININE 0.71 11/04/2012   GLUCOSE 105* 11/04/2012   No results found for this basename: INR, PROTIME    Assessment/Plan: L5-S1 spondylolisthesis, spinal stenosis, lumbago, lumbar radiculopathy: I discussed the situation with the patient and his wife. I reviewed the MRI scan with them and pointed out the abnormalities. We have discussed the various treatment options including surgery. I described the surgical treatment option of a L5-S1 decompression, instrumentation, and fusion. I've shown him surgical models. We have discussed the risks, benefits, alternatives, and likelihood of achieving our goals with surgery. I've answered all the patient's questions. He has decided to proceed with surgery.   Laurelin Elson,Nero D 11/11/2012 7:15 AM

## 2012-11-11 NOTE — Anesthesia Preprocedure Evaluation (Signed)
Anesthesia Evaluation  Patient identified by MRN, date of birth, ID band Patient awake    Reviewed: Allergy & Precautions, H&P , NPO status , Patient's Chart, lab work & pertinent test results, reviewed documented beta blocker date and time   Airway Mallampati: II TM Distance: >3 FB Neck ROM: full    Dental   Pulmonary neg pulmonary ROS,  breath sounds clear to auscultation        Cardiovascular hypertension, On Medications and On Home Beta Blockers Rhythm:regular     Neuro/Psych PSYCHIATRIC DISORDERS negative neurological ROS     GI/Hepatic negative GI ROS, Neg liver ROS,   Endo/Other  negative endocrine ROS  Renal/GU negative Renal ROS  negative genitourinary   Musculoskeletal   Abdominal   Peds  Hematology negative hematology ROS (+)   Anesthesia Other Findings See surgeon's H&P   Reproductive/Obstetrics negative OB ROS                           Anesthesia Physical Anesthesia Plan  ASA: II  Anesthesia Plan: General   Post-op Pain Management:    Induction: Intravenous  Airway Management Planned: Oral ETT  Additional Equipment:   Intra-op Plan:   Post-operative Plan: Extubation in OR  Informed Consent: I have reviewed the patients History and Physical, chart, labs and discussed the procedure including the risks, benefits and alternatives for the proposed anesthesia with the patient or authorized representative who has indicated his/her understanding and acceptance.   Dental Advisory Given  Plan Discussed with: CRNA and Surgeon  Anesthesia Plan Comments:         Anesthesia Quick Evaluation  

## 2012-11-11 NOTE — Transfer of Care (Signed)
Immediate Anesthesia Transfer of Care Note  Patient: SAYAN ALDAVA  Procedure(s) Performed: Procedure(s) with comments: POSTERIOR LUMBAR FUSION 1 LEVEL (N/A) - Lumbar Five-Sacral One Posterior Lumbar Interbody Fusion with Interbody prosthesis posterior lateral arthrodesis and posterior nonsegmental instrumentation  Patient Location: PACU  Anesthesia Type:General  Level of Consciousness: awake, alert , oriented, patient cooperative and responds to stimulation  Airway & Oxygen Therapy: Patient Spontanous Breathing and Patient connected to nasal cannula oxygen  Post-op Assessment: Report given to PACU RN, Post -op Vital signs reviewed and stable and Patient moving all extremities X 4  Post vital signs: Reviewed and stable  Complications: No apparent anesthesia complications

## 2012-11-12 MED FILL — Sodium Chloride IV Soln 0.9%: INTRAVENOUS | Qty: 1000 | Status: AC

## 2012-11-12 MED FILL — Heparin Sodium (Porcine) Inj 1000 Unit/ML: INTRAMUSCULAR | Qty: 30 | Status: AC

## 2012-11-12 NOTE — Evaluation (Signed)
Physical Therapy Evaluation Patient Details Name: Anthony Berg MRN: [id number] DOB: 1965/07/15 Today's Date: 11/12/2012 Time: 4098-1191 PT Time Calculation (min): 22 min  PT Assessment / Plan / Recommendation History of Present Illness  47 y.o. male admitted to [facility] on 11/11/12 for elective L5-S1 PLIF.    Clinical Impression  Pt is POD #1 s/p lumbar surgery and is moving well despite surgical pain.  I anticipate that he will do well and will be able to go home with family's support at discharge.   PT to follow acutely for deficits listed below.       PT Assessment  Patient needs continued PT services    Follow Up Recommendations  No PT follow up;Supervision for mobility/OOB    Does the patient have the potential to tolerate intense rehabilitation     Yes  Barriers to Discharge Other (comment) (None) None    Equipment Recommendations  None recommended by PT    Recommendations for Other Services Other (comment) (None)   Frequency Min 5X/week    Precautions / Restrictions Precautions Precautions: Back Precaution Booklet Issued: Yes (comment) Precaution Comments: back handout reviewed and educated on functional examples of how to keep and when you might break your back precautions Required Braces or Orthoses: Spinal Brace Spinal Brace: Lumbar corset;Applied in sitting position   Pertinent Vitals/Pain See vitals flow sheet.       Mobility  Bed Mobility Bed Mobility: Rolling Right;Right Sidelying to Sit;Sitting - Scoot to [place] of Bed;Sit to Sidelying Right Rolling Right: 5: Supervision Right Sidelying to Sit: 5: Supervision;HOB flat Supine to Sit: 5: Supervision;HOB flat Sitting - Scoot to Edge of Bed: 5: Supervision Sit to Supine: 4: Min guard;HOB flat Details for Bed Mobility Assistance: pt needed incr time to complete bed mobility but able to do it without (A).  Bed was flat, no railing went out right side to simulate home environment.  Minimal cues for  technique Transfers Sit to Stand: 5: Supervision;From elevated surface;From bed Stand to Sit: 5: Supervision;To elevated surface;To bed Details for Transfer Assistance: educated on hand placement and safety with RW Ambulation/Gait Ambulation/Gait Assistance: 4: Min guard Ambulation Distance (Feet): 150 Feet Assistive device: Rolling walker Ambulation/Gait Assistance Details: Min guard assist for safety, verbal cues for upright posture, light hands on RW and safe RW use.   Gait Pattern: Step-through pattern;Shuffle;Trunk flexed Gait velocity: decreased        PT Diagnosis: Difficulty walking;Abnormality of gait;Generalized weakness;Acute pain  PT Problem List: Decreased strength;Decreased activity tolerance;Decreased balance;Decreased mobility;Decreased knowledge of use of DME;Decreased knowledge of precautions;Pain PT Treatment Interventions: DME instruction;Gait training;Stair training;Functional mobility training;Therapeutic activities;Therapeutic exercise;Balance training;Neuromuscular re-education;Patient/family education;Modalities     PT Goals(Current goals can be found in the care plan section) Acute Rehab PT Goals Patient Stated Goal: to return to outpatient PT if he needs to after discharge PT Goal Formulation: With patient Time For Goal Achievement: 11/19/12 Potential to Achieve Goals: Good  Visit Information  Last PT Received On: 11/12/12 Assistance Needed: +1 PT/OT Co-Evaluation/Treatment: Yes History of Present Illness: 47 y.o. male admitted [facility] 11/11[facility]12 for elective L5-S1 PLIF.         Prior Functioning  Home Living Family/patient expects to be discharged to:: Private residence Living Arrangements: Spouse/significant other Type of Home: House Home Access: Stairs to enter [employer] of Steps: 15 Entrance Stairs-Rails: Can reach both Home Layout: Two level;Able to live on main level with bedroom/bathroom Home Equipment: Dan Humphreys - 2 wheels;Cane -  single point;Crutches;Bedside commode;Hand held  shower head;Shower seat - built in (mother-in-law's equipment at his home-she lives there) Additional Comments: driving,  Prior Function Level of Independence: Independent Comments: not working, was driving Musician: No difficulties Dominant Hand: Right    Cognition  Cognition Arousal/Alertness: Awake/alert Behavior During Therapy: WFL for tasks assessed/performed Overall Cognitive Status: Within Functional Limits for tasks assessed    Extremity/Trunk Assessment Upper Extremity Assessment Upper Extremity Assessment: Defer to OT evaluation Lower Extremity Assessment Lower Extremity Assessment: Generalized weakness Cervical / Trunk Assessment Cervical / Trunk Assessment: Normal   Balance Balance Balance Assessed: Yes Static Sitting Balance Static Sitting - Balance Support: Bilateral upper extremity supported;Feet supported Static Sitting - Level of Assistance: 6: Modified independent (Device/Increase time) Static Standing Balance Static Standing - Balance Support: Bilateral upper extremity supported Static Standing - Level of Assistance: 5: Stand by assistance Dynamic Standing Balance Dynamic Standing - Balance Support: Bilateral upper extremity supported Dynamic Standing - Level of Assistance: 4: Min assist  End of Session PT - End of Session Equipment Utilized During Treatment: Back brace Activity Tolerance: Patient limited by fatigue;Patient limited by pain Patient left: in bed;with call bell/phone within reach Nurse Communication: Patient requests pain meds    Wise Fees B. Zara Wendt, PT, DPT [phone]   11/12/2012, 4:06 PM

## 2012-11-12 NOTE — Progress Notes (Signed)
Pt has difficulty voiding after foley removed. Bladder scan done and showed more than . In and out cath done using sterile technique. Was able to empty of clear, yellow colored urine. Will continue to monitor pt.

## 2012-11-12 NOTE — Progress Notes (Signed)
Pt Foley DC, pt DTV by 1415.

## 2012-11-12 NOTE — Progress Notes (Signed)
Patient ID: MYLIK PRO, male   DOB: 1966/01/01, 47 y.o.   MRN: [id number] Subjective:  The patient is alert and pleasant. He looks well. He is in no apparent distress.  Objective: Vital signs in last 24 hours: Temp:  [97.4 F (36.3 C)-98.8 F (37.1 C)] 98.8 F (37.1 C) (10/03 0550) Pulse Rate:  [87-123] 111 (10/03 0550) Resp:  [10-20] 13 (10/03 0550) BP: (112-150)/(67-102) 112/67 mmHg (10/03 0550) SpO2:  [92 %-98 %] 95 % (10/03 0550) Weight:  [85.276 kg (188 lb)] 85.276 kg (188 lb) (10/02 1400)  Intake/Output from previous day: 10/02 0701 - 10/03 0700 In: 2060 [P.O.:60; I.V.:2000] Out: 3110 [Urine:2750; Drains:160; Blood:200] Intake/Output this shift: Total I/O In: -  Out: 320 [Urine:300; Drains:20]  Physical exam patient is alert and oriented. His strength is normal his lower extremities. His dressing has a small bloodstain.  Lab Results: No results found for this basename: WBC, HGB, HCT, PLT,  in the last 72 hours BMET No results found for this basename: NA, K, CL, CO2, GLUCOSE, BUN, CREATININE, CALCIUM,  in the last 72 hours  Studies/Results: Dg Lumbar Spine 2-3 Views  11/11/2012   CLINICAL DATA:  L5-S1 posterior lumbar interbody fusion  EXAM: DG C-ARM 1-60 MIN; LUMBAR SPINE - 2-3 VIEW  COMPARISON:  Preoperative MRI lumbar spine 08/05/2012  FINDINGS: Three intraoperative spot radiographs demonstrate soft tissue spreaders posterior to the L5-S1 disc space. There is a partial L5-S1 posterior lumbar interbody fusion with bilateral pedicle screws. No rods have been placed. An interbody graft is noted at L5-S1. No evidence of hardware complication. Probable L5 laminectomy as well.  IMPRESSION: Intraoperative spot views obtained during in progress L5-S1 PLIF as above.   Electronically Signed   By: Malachy Moan   On: 11/11/2012 14:22   Dg Lumbar Spine 1 View  11/11/2012   CLINICAL DATA:  Spondylolisthesis and lumbar radiculopathy. Low back pain. Instrument localization for L5  on S1  EXAM: LUMBAR SPINE - 1 VIEW  COMPARISON:  09/14/2012 and 08/05/2012  FINDINGS: Retractor is noted with underlying intraoperative sponge. Surgical instrument marks the L5-S1 disc space. Anterolisthesis of L5 on S1 is noted.  IMPRESSION: Surgical instrument marks the L5-S1 disc space.   Electronically Signed   By: Jerene Dilling M.D.   On: 11/11/2012 12:00   Dg C-arm 1-60 Min  11/11/2012   CLINICAL DATA:  L5-S1 posterior lumbar interbody fusion  EXAM: DG C-ARM 1-60 MIN; LUMBAR SPINE - 2-3 VIEW  COMPARISON:  Preoperative MRI lumbar spine 08/05/2012  FINDINGS: Three intraoperative spot radiographs demonstrate soft tissue spreaders posterior to the L5-S1 disc space. There is a partial L5-S1 posterior lumbar interbody fusion with bilateral pedicle screws. No rods have been placed. An interbody graft is noted at L5-S1. No evidence of hardware complication. Probable L5 laminectomy as well.  IMPRESSION: Intraoperative spot views obtained during in progress L5-S1 PLIF as above.   Electronically Signed   By: Malachy Moan   On: 11/11/2012 14:22    Assessment/Plan: Postop day #1: The patient is doing well. We will mobilize him with PT and OT. He may be able to go home tomorrow. I gave him his discharge instructions and answered all his questions.  LOS: 1 day     Paulette Rockford,Alegandro D 11/12/2012, 8:00 AM

## 2012-11-12 NOTE — Evaluation (Signed)
Occupational Therapy Evaluation Patient Details Name: Anthony Berg MRN: [id number] DOB: May 20, 1965 Today's Date: 11/12/2012 Time: 1308-6578 OT Time Calculation (min): 37 min  OT Assessment / Plan / Recommendation History of present illness 47 yo male s/p PLIF L5-S1   Clinical Impression   Patient is s/p L5-S1 PLIF  surgery resulting in functional limitations due to the deficits listed below (see OT problem list).  Patient will benefit from skilled OT acutely to increase independence and safety with ADLS to allow discharge home without OT services.     OT Assessment  Patient needs continued OT Services    Follow Up Recommendations  No OT follow up    Barriers to Discharge      Equipment Recommendations  None recommended by OT    Recommendations for Other Services    Frequency  Min 2X/week    Precautions / Restrictions Precautions Precautions: Back Precaution Comments: back handout reviewed and educated on adls Required Braces or Orthoses: Spinal Brace Spinal Brace: Lumbar corset;Applied in sitting position   Pertinent Vitals/Pain Pushing PCA x2 during session RN Asher Muir providing oral medication at end of session    ADL  Grooming: Wash/dry hands;Wash/dry face;Supervision/safety Where Assessed - Grooming: Unsupported standing Upper Body Dressing: Supervision/safety Where Assessed - Upper Body Dressing: Unsupported sitting (don doff brace) Lower Body Dressing: Minimal assistance Where Assessed - Lower Body Dressing: Unsupported sitting (extensive effort to cross BIL LE this session) Toilet Transfer: Supervision/safety Toilet Transfer Method: Sit to Barista: Raised toilet seat with arms (or 3-in-1 over toilet) Equipment Used: Back brace;Rolling walker Transfers/Ambulation Related to ADLs: pt ambulating with RW and picking it up initially to slide it. Pt educated on pushing RW like grocery cart and improvements with incr ambulation ADL Comments: Pt  educated on back precautions with all ADLS. pt familiar with bed mobility sequence from previous PT outpatient. pt asking whne he can return to PT outpatient. pt educated that outpatient will start sometime after 5 weeks when Dr Lovell Sheehan refers him. Pt familiar with don doff brace. Pt progressing well and will need education on shower transfer.     OT Diagnosis: Generalized weakness;Acute pain  OT Problem List: Decreased strength;Decreased activity tolerance;Impaired balance (sitting and/or standing);Decreased safety awareness;Decreased knowledge of use of DME or AE;Decreased knowledge of precautions;Pain OT Treatment Interventions: Self-care/ADL training;Therapeutic exercise;DME and/or AE instruction;Therapeutic activities;Patient/family education;Balance training   OT Goals(Current goals can be found in the care plan section) Acute Rehab OT Goals Patient Stated Goal: to return to outpatient PT OT Goal Formulation: With patient Time For Goal Achievement: 11/26/12 Potential to Achieve Goals: Good  Visit Information  Last OT Received On: 11/12/12 Assistance Needed: +1 History of Present Illness: 47 yo male s/p PLIF L5-S1       Prior Functioning     Home Living Family/patient expects to be discharged to:: Private residence Living Arrangements: Spouse/significant other Type of Home: House Home Access: Stairs to enter Secretary/administrator of Steps: 15 Entrance Stairs-Rails: Can reach both Home Layout: Two level;Able to live on main level with bedroom/bathroom Home Equipment: Dan Humphreys - 2 wheels;Cane - single point;Crutches;Bedside commode;Hand held shower head;Shower seat - built in Additional Comments: driving,  Prior Function Level of Independence: Independent Communication Communication: No difficulties Dominant Hand: Right         Vision/Perception Vision - History Baseline Vision: No visual deficits Patient Visual Report: No change from baseline   Cognition   Cognition Arousal/Alertness: Awake/alert Behavior During Therapy: WFL for tasks assessed/performed Overall Cognitive Status:  Within Functional Limits for tasks assessed    Extremity/Trunk Assessment Upper Extremity Assessment Upper Extremity Assessment: Overall WFL for tasks assessed Lower Extremity Assessment Lower Extremity Assessment: Defer to PT evaluation Cervical / Trunk Assessment Cervical / Trunk Assessment: Normal     Mobility Bed Mobility Bed Mobility: Supine to Sit;Sitting - Scoot to Edge of Bed;Sit to Supine Supine to Sit: 5: Supervision;HOB flat Sitting - Scoot to Edge of Bed: 5: Supervision Sit to Supine: 4: Min guard;HOB flat Details for Bed Mobility Assistance: pt needed incr time to complete bed mobility but able to do it without (A) Transfers Transfers: Sit to Stand;Stand to Sit Sit to Stand: 5: Supervision;With upper extremity assist;From bed Stand to Sit: 5: Supervision;With upper extremity assist;To bed Details for Transfer Assistance: educated on hand placement and safety with RW     Exercise     Balance     End of Session OT - End of Session Activity Tolerance: Patient tolerated treatment well Patient left: in bed;with call bell/phone within reach Nurse Communication: Mobility status;Precautions  GO     [name] 11/12/2012, 3:55 PM  Pager: [phone]

## 2012-11-13 MED ORDER — BISACODYL 10 MG RE SUPP: 10.0000 mg | Freq: Every day | RECTAL | Status: DC | PRN

## 2012-11-13 MED ORDER — MAGNESIUM HYDROXIDE 400 MG/5ML PO SUSP: 30.0000 mL | Freq: Every day | ORAL | Status: DC | PRN

## 2012-11-13 NOTE — Progress Notes (Signed)
Doing well. C/o appropriate incisional soreness. Some abd discomfort  - had sl fever  -  Needing I&O caths  Temp:  [98.3 F (36.8 C)-100.4 F (38 C)] 98.8 F (37.1 C) (10/04 0910) Pulse Rate:  [106-117] 106 (10/04 0910) Resp:  [13-18] 16 (10/04 0910) BP: (136-157)/(79-96) 137/89 mmHg (10/04 0910) SpO2:  [92 %-98 %] 98 % (10/04 0910) Good strength and sensation Incision CDI  Plan: Increase activity  - d/c PCA

## 2012-11-13 NOTE — Progress Notes (Signed)
Pt unable to void after in and out cath earlier. Bladder scan done and showed 824 ml. In and out cath done and drained 900 ml of yellow colored urine. Procedure done using sterile technique. Pt able to tolerate the procedure. Will continue to monitor pt.

## 2012-11-13 NOTE — Progress Notes (Signed)
Physical Therapy Treatment Patient Details Name: Anthony Berg MRN: [id number] DOB: 10-24-1965 Today's Date: 11/13/2012 Time: 7846-9629 PT Time Calculation (min): 26 min  PT Assessment / Plan / Recommendation  History of Present Illness 47 y.o. male admitted to [facility] on 11/11/12 for elective L5-S1 PLIF.     PT Comments   Pt c/o increased soreness compared to yesterday & c/o nausea however still moving fairly well.    Follow Up Recommendations  No PT follow up;Supervision for mobility/OOB     Does the patient have the potential to tolerate intense rehabilitation     Barriers to Discharge        Equipment Recommendations  None recommended by PT    Recommendations for Other Services    Frequency Min 5X/week   Progress towards PT Goals Progress towards PT goals: Progressing toward goals  Plan Current plan remains appropriate    Precautions / Restrictions Precautions Precautions: Back Required Braces or Orthoses: Spinal Brace Spinal Brace: Lumbar corset;Applied in sitting position Restrictions Weight Bearing Restrictions: No   Pertinent Vitals/Pain 6/10 back.  Premedicated.  & c/o nausea    Mobility  Bed Mobility Bed Mobility: Rolling Right;Right Sidelying to Sit Rolling Right: 4: Min assist Right Sidelying to Sit: 5: Supervision;HOB flat Details for Bed Mobility Assistance: (A) to complete rolling onto Rt side due to pain.  Cues for technique.   Transfers Transfers: Sit to Stand;Stand to Sit Sit to Stand: 4: Min guard;With upper extremity assist;From bed Stand to Sit: With upper extremity assist;With armrests;To chair/3-in-1;5: Supervision Details for Transfer Assistance: Cues for hand placement & to decrease trunk flexion with sit>stand.   Ambulation/Gait Ambulation/Gait Assistance: 4: Min guard Ambulation Distance (Feet): 150 Feet Assistive device: Rolling walker Ambulation/Gait Assistance Details: slow, guarded.  Pressing down on RW with UE's Gait Pattern:  Step-through pattern;Decreased stride length Gait velocity: decreased General Gait Details: slow but steady Stairs: No Wheelchair Mobility Wheelchair Mobility: No      PT Goals (current goals can now be found in the care plan section) Acute Rehab PT Goals Patient Stated Goal: to return to outpatient PT if he needs to after discharge PT Goal Formulation: With patient Time For Goal Achievement: 11/19/12 Potential to Achieve Goals: Good  Visit Information  Last PT Received On: 11/13/12 Assistance Needed: +1 History of Present Illness: 47 y.o. male admitted to [facility] on 11/11/12 for elective L5-S1 PLIF.      Subjective Data  Patient Stated Goal: to return to outpatient PT if he needs to after discharge   Cognition  Cognition Arousal/Alertness: Awake/alert Behavior During Therapy: WFL for tasks assessed/performed Overall Cognitive Status: Within Functional Limits for tasks assessed    Balance     End of Session PT - End of Session Equipment Utilized During Treatment: Back brace Activity Tolerance: Patient tolerated treatment well;Patient limited by pain Patient left: in chair;with call bell/phone within reach Nurse Communication: Mobility status   GP     Lara Mulch 11/13/2012, 9:52 AM   Verdell Face, PTA [phone] 11/13/2012

## 2012-11-13 NOTE — Progress Notes (Signed)
Occupational Therapy Treatment Patient Details Name: Anthony Berg MRN: [id number] DOB: 1965-11-01 Today's Date: 11/13/2012 Time: 7829-5621 OT Time Calculation (min): 14 min  OT Assessment / Plan / Recommendation  History of present illness 47 y.o. male admitted to [facility] on 11/11/12 for elective L5-S1 PLIF.     OT comments  Patient provided treatment by Occupational Therapy with no further acute OT needs identified. All education has been completed and the patient has no further questions. See below for any follow-up Occupational Therapy or equipment needs. OT to sign off. Thank you for referral.    Follow Up Recommendations  No OT follow up    Barriers to Discharge       Equipment Recommendations  None recommended by OT    Recommendations for Other Services    Frequency     Progress towards OT Goals Progress towards OT goals: Goals met/education completed, patient discharged from OT  Plan All goals met and education completed, patient discharged from OT services    Precautions / Restrictions Precautions Precautions: Back Required Braces or Orthoses: Spinal Brace Spinal Brace: Lumbar corset;Applied in sitting position   Pertinent Vitals/Pain Better controlled on oral medication Pt with medication ~1 hr ago and no pain reported during session    ADL  Lower Body Dressing: Modified independent Where Assessed - Lower Body Dressing: Unsupported sitting (able to cross bil LE) Toilet Transfer: Modified independent Toilet Transfer Method: Sit to Barista: Regular height toilet Toileting - Clothing Manipulation and Hygiene: Modified independent Where Assessed - Toileting Clothing Manipulation and Hygiene: Sit to stand from 3-in-1 or toilet Tub/Shower Transfer: Supervision/safety Tub/Shower Transfer Method: Science writer: Walk in shower Equipment Used: Back brace;Gait belt;Rolling walker ADL Comments: Pt educated on LB dressing  with crossing BIL LE and able to do it. pt educated on reacher and many uses of the reacher. PT educated and demonstrated shower transfer. Pt completed x2. Pt educated on transfer into truck for d/c home. Pt with no further questions. Pt currently in bathroom and educated to pull call bell once finished. RN aware and ready to assist     OT Diagnosis:    OT Problem List:   OT Treatment Interventions:     OT Goals(current goals can now be found in the care plan section) Acute Rehab OT Goals Patient Stated Goal: to return to outpatient PT if he needs to after discharge OT Goal Formulation: With patient Time For Goal Achievement: 11/26/12 Potential to Achieve Goals: Good  Visit Information  Last OT Received On: 11/13/12 Assistance Needed: +1 History of Present Illness: 47 y.o. male admitted to [facility] on 11/11/12 for elective L5-S1 PLIF.      Subjective Data      Prior Functioning       Cognition  Cognition Arousal/Alertness: Awake/alert Behavior During Therapy: WFL for tasks assessed/performed Overall Cognitive Status: Within Functional Limits for tasks assessed    Mobility  Bed Mobility Bed Mobility: Supine to Sit;Sitting - Scoot to Edge of Bed Rolling Right: 6: Modified independent (Device/Increase time) Right Sidelying to Sit: 6: Modified independent (Device/Increase time) Supine to Sit: 6: Modified independent (Device/Increase time) Details for Bed Mobility Assistance: excellent return demo Transfers Transfers: Sit to Stand;Stand to Sit Sit to Stand: 4: Min guard;With upper extremity assist;From bed Stand to Sit: 4: Min guard;With upper extremity assist;To chair/3-in-1    Exercises      Balance     End of Session OT - End of Session Activity Tolerance: Patient  tolerated treatment well Patient left: Other (comment) (in bathroom with RN ready to assist) Nurse Communication: Mobility status;Precautions  GO     [name] 11/13/2012, 1:56 PM Pager: [phone]

## 2012-11-14 MED ORDER — DIAZEPAM 5 MG PO TABS: 5.0000 mg | ORAL_TABLET | Freq: Four times a day (QID) | ORAL | Status: DC | PRN

## 2012-11-14 MED ORDER — INFLUENZA VAC SPLIT QUAD 0.5 ML IM SUSP: 0.5000 mL | INTRAMUSCULAR | Status: DC

## 2012-11-14 MED ORDER — OXYCODONE-ACETAMINOPHEN 5-325 MG PO TABS: 1.0000 | ORAL_TABLET | ORAL | Status: DC | PRN

## 2012-11-14 NOTE — Progress Notes (Signed)
hemovac DCd

## 2012-11-14 NOTE — Progress Notes (Signed)
Physical Therapy Treatment Patient Details Name: Anthony Berg MRN: [id number] DOB: 1965/02/26 Today's Date: 11/14/2012 Time: 3086-5784 PT Time Calculation (min): 16 min  PT Assessment / Plan / Recommendation  History of Present Illness 47 y.o. male admitted to [facility] on 11/11/12 for elective L5-S1 PLIF.     PT Comments   Much better activity tolerance; Discussed gait sequence and ideas for negotiating steps to enter home; Pt opted not to practice, but did describe acceptable technique; OK fo rdc home from PT standpoint  Follow Up Recommendations  No PT follow up;Supervision for mobility/OOB     Does the patient have the potential to tolerate intense rehabilitation     Barriers to Discharge        Equipment Recommendations  Rolling walker with 5" wheels    Recommendations for Other Services    Frequency Min 5X/week   Progress towards PT Goals Progress towards PT goals: Progressing toward goals  Plan Current plan remains appropriate    Precautions / Restrictions Precautions Precautions: Back Required Braces or Orthoses: Spinal Brace Spinal Brace: Lumbar corset;Applied in sitting position   Pertinent Vitals/Pain 6/10 back pain, "manageable" per pt, and he is agreeable to walking RN provided medication to assist with pain control     Mobility  Bed Mobility Bed Mobility: Supine to Sit;Sitting - Scoot to [place] of Bed Rolling Right: 6: Modified independent (Device/Increase time) Right Sidelying to Sit: 6: Modified independent (Device/Increase time) Supine to Sit: 6: Modified independent (Device/Increase time) Sit to Supine: 6: Modified independent (Device/Increase time) Details for Bed Mobility Assistance: excellent return demo Transfers Transfers: Sit to Stand;Stand to Sit Sit to Stand: With upper extremity assist;From bed;5: Supervision Stand to Sit: With upper extremity assist;To chair/3-in-1;5: Supervision Details for Transfer Assistance: Cues for hand placement   Ambulation/Gait Ambulation/Gait Assistance: 5: Supervision Ambulation Distance (Feet): 150 Feet Assistive device: Rolling walker Ambulation/Gait Assistance Details: Overall tolerating household distances well Gait Pattern: Step-through pattern;Decreased stride length Gait velocity: decreased General Gait Details: slow but steady Stairs:  (See PT comments)    Exercises     PT Diagnosis:    PT Problem List:   PT Treatment Interventions:     PT Goals (current goals can now be found in the care plan section) Acute Rehab PT Goals Patient Stated Goal: to return to outpatient PT if he needs to after discharge Time For Goal Achievement: 11/19/12 Potential to Achieve Goals: Good  Visit Information  Last PT Received On: 11/14/12 Assistance Needed: +1 History of Present Illness: 47 y.o. male admitted to [facility] on 11/11/12 for elective L5-S1 PLIF.      Subjective Data  Patient Stated Goal: to return to outpatient PT if he needs to after discharge   Cognition  Cognition Arousal/Alertness: Awake/alert Behavior During Therapy: WFL for tasks assessed/performed Overall Cognitive Status: Within Functional Limits for tasks assessed    Balance     End of Session PT - End of Session Equipment Utilized During Treatment: Back brace Activity Tolerance: Patient tolerated treatment well Patient left: in bed;with call bell/phone within reach Nurse Communication: Mobility status;Patient requests pain meds   GP     Van Clines [facility] [place], [place] [phone]  11/14/2012, 1:09 PM

## 2012-11-14 NOTE — Discharge Summary (Signed)
Physician Discharge Summary  Patient ID: Anthony Berg MRN: [id number] DOB/AGE: 15-Sep-1965 47 y.o.  Admit date: 11/11/2012 Discharge date: 11/14/2012  Admission Diagnoses:  Discharge Diagnoses:  Active Problems:   * No active hospital problems. *   Discharged Condition: good  Hospital Course: Patient admitted to the hospital where he underwent uncomplicated L5-S1 decompression and fusion. Postoperatively he is done well. Preoperative back and leg pain improved. Patient is up ambulating without difficulty. He feels ready for discharge home.  Consults:   Significant Diagnostic Studies:   Treatments:   Discharge Exam: Blood pressure 125/73, pulse 101, temperature 99.4 F (37.4 C), temperature source Oral, resp. rate 16, height 5\' 11"  (1.803 m), weight 85.276 kg (188 lb), SpO2 94.00%. Awake and alert. Oriented and appropriate. Motor and sensory function intact. Wound clean and dry. Chest and abdomen benign.  Disposition:      Medication List         bisoprolol-hydrochlorothiazide 2.5-6.25 MG per tablet  Commonly known as:  ZIAC  Take 1 tablet by mouth daily.     buPROPion 75 MG tablet  Commonly known as:  WELLBUTRIN  Take 75 mg by mouth 2 (two) times daily.     diazepam 5 MG tablet  Commonly known as:  VALIUM  Take 1-2 tablets (5-10 mg total) by mouth every 6 (six) hours as needed.     HYDROcodone-acetaminophen 10-325 MG per tablet  Commonly known as:  NORCO  Take 1 tablet by mouth every 6 (six) hours as needed for pain.     oxyCODONE-acetaminophen 5-325 MG per tablet  Commonly known as:  PERCOCET/ROXICET  Take 1-2 tablets by mouth every 4 (four) hours as needed.     SOMA 350 MG tablet  Generic drug:  carisoprodol  Take 350 mg by mouth 2 (two) times daily.           Follow-up Information   Call Cristi Loron, MD.   Specialty:  Neurosurgery   Contact information:   [address] [place]  [address] [phone]       Signed: Temple Pacini 11/14/2012, 11:48 AM

## 2012-11-14 NOTE — Progress Notes (Signed)
   CARE MANAGEMENT NOTE 11/14/2012  Patient:  LUNDON, VERDEJO   Account Number:  [id number]  Date Initiated:  11/14/2012  Documentation initiated by:  [facility]  Subjective/Objective Assessment:   adm: foruncomplicated L5-S1 decompression and fusion     Action/Plan:   discharge planning   Anticipated DC Date:  11/14/2012   Anticipated DC Plan:  HOME/SELF CARE      DC Planning Services  CM consult      Choice offered to / List presented to:     DME arranged  Levan Hurst      DME agency  [facility]        Status of service:  Completed, signed off Medicare Important Message given?   (If response is "NO", the following Medicare IM given date fields will be blank) Date Medicare IM given:   Date Additional Medicare IM given:    Discharge Disposition:  HOME/SELF CARE  Per UR Regulation:    If discussed at Long Length of Stay Meetings, dates discussed:    Comments:  11/14/12 12:32 CM called by RN to arrange rolling walker. RW will be delivered to pt's room prior to discharge.  No HH ordered and verified by RN.  No other CM needs were communicated, Freddy Jaksch, [facility] [phone].

## 2013-01-10 ENCOUNTER — Inpatient Hospital Stay (HOSPITAL_COMMUNITY)
Admission: EM | Admit: 2013-01-10 | Discharge: 2013-01-13 | DRG: 176 | Disposition: A | Discharge status: Home / Self Care | Payer: BC Managed Care – PPO | Attending: Internal Medicine | Admitting: Internal Medicine

## 2013-01-10 ENCOUNTER — Emergency Department (HOSPITAL_COMMUNITY): Payer: BC Managed Care – PPO

## 2013-01-10 ENCOUNTER — Encounter (HOSPITAL_COMMUNITY): Payer: Self-pay | Admitting: Emergency Medicine

## 2013-01-10 DIAGNOSIS — Z800 Family history of malignant neoplasm of digestive organs: Secondary | ICD-10-CM

## 2013-01-10 DIAGNOSIS — F329 Major depressive disorder, single episode, unspecified: Secondary | ICD-10-CM | POA: Diagnosis present

## 2013-01-10 DIAGNOSIS — R0902 Hypoxemia: Secondary | ICD-10-CM | POA: Diagnosis present

## 2013-01-10 DIAGNOSIS — Q762 Congenital spondylolisthesis: Secondary | ICD-10-CM

## 2013-01-10 DIAGNOSIS — F3289 Other specified depressive episodes: Secondary | ICD-10-CM | POA: Diagnosis present

## 2013-01-10 DIAGNOSIS — I2699 Other pulmonary embolism without acute cor pulmonale: Secondary | ICD-10-CM

## 2013-01-10 DIAGNOSIS — M4316 Spondylolisthesis, lumbar region: Secondary | ICD-10-CM | POA: Diagnosis present

## 2013-01-10 DIAGNOSIS — F411 Generalized anxiety disorder: Secondary | ICD-10-CM | POA: Diagnosis present

## 2013-01-10 DIAGNOSIS — I10 Essential (primary) hypertension: Secondary | ICD-10-CM

## 2013-01-10 DIAGNOSIS — F17200 Nicotine dependence, unspecified, uncomplicated: Secondary | ICD-10-CM | POA: Diagnosis present

## 2013-01-10 DIAGNOSIS — Z833 Family history of diabetes mellitus: Secondary | ICD-10-CM

## 2013-01-10 DIAGNOSIS — Z86711 Personal history of pulmonary embolism: Secondary | ICD-10-CM

## 2013-01-10 DIAGNOSIS — I369 Nonrheumatic tricuspid valve disorder, unspecified: Secondary | ICD-10-CM

## 2013-01-10 DIAGNOSIS — Z8249 Family history of ischemic heart disease and other diseases of the circulatory system: Secondary | ICD-10-CM

## 2013-01-10 DIAGNOSIS — Z981 Arthrodesis status: Secondary | ICD-10-CM

## 2013-01-10 HISTORY — DX: Personal history of pulmonary embolism: Z86.711

## 2013-01-10 LAB — CBC WITH DIFFERENTIAL/PLATELET
Basophils Absolute: 0.1 10*3/uL (ref 0.0–0.1)
Basophils Relative: 0 % (ref 0–1)
Hemoglobin: 14 g/dL (ref 13.0–17.0)
Lymphs Abs: 1.5 10*3/uL (ref 0.7–4.0)
MCHC: 33.5 g/dL (ref 30.0–36.0)
Monocytes Relative: 14 % — ABNORMAL HIGH (ref 3–12)
Neutro Abs: 10.1 10*3/uL — ABNORMAL HIGH (ref 1.7–7.7)
Neutrophils Relative %: 74 % (ref 43–77)
RBC: 4.37 MIL/uL (ref 4.22–5.81)
RDW: 12.6 % (ref 11.5–15.5)

## 2013-01-10 LAB — BASIC METABOLIC PANEL
BUN: 9 mg/dL (ref 6–23)
CO2: 26 mEq/L (ref 19–32)
Calcium: 9 mg/dL (ref 8.4–10.5)
Chloride: 96 mEq/L (ref 96–112)
Creatinine, Ser: 0.74 mg/dL (ref 0.50–1.35)
Sodium: 134 mEq/L — ABNORMAL LOW (ref 135–145)

## 2013-01-10 LAB — HEPATIC FUNCTION PANEL
ALT: 16 U/L (ref 0–53)
AST: 16 U/L (ref 0–37)
Albumin: 3.3 g/dL — ABNORMAL LOW (ref 3.5–5.2)
Indirect Bilirubin: 0.7 mg/dL (ref 0.3–0.9)
Total Protein: 7.6 g/dL (ref 6.0–8.3)

## 2013-01-10 LAB — TROPONIN I
Troponin I: <0.3 ng/mL (ref <0.30)
Troponin I: <0.3 ng/mL (ref <0.30)

## 2013-01-10 LAB — APTT: aPTT: 36 seconds (ref 24–37)

## 2013-01-10 LAB — PROTIME-INR: INR: 1.05 (ref 0.00–1.49)

## 2013-01-10 LAB — HEPARIN LEVEL (UNFRACTIONATED)
Heparin Unfractionated: 0.49 IU/mL (ref 0.30–0.70)
Heparin Unfractionated: <0.1 IU/mL — ABNORMAL LOW (ref 0.30–0.70)

## 2013-01-10 LAB — MRSA PCR SCREENING: MRSA by PCR: NEGATIVE

## 2013-01-10 MED ORDER — MORPHINE SULFATE 4 MG/ML IJ SOLN
INTRAMUSCULAR | Status: AC
  Filled 2013-01-10: qty 1

## 2013-01-10 MED ORDER — HEPARIN BOLUS VIA INFUSION
5000.0000 [IU] | Freq: Once | INTRAVENOUS | Status: AC
  Administered 2013-01-10: 5000 [IU] via INTRAVENOUS
  Filled 2013-01-10: qty 5000

## 2013-01-10 MED ORDER — SODIUM CHLORIDE 0.9 % IJ SOLN
3.0000 mL | Freq: Two times a day (BID) | INTRAMUSCULAR | Status: DC
  Administered 2013-01-11 – 2013-01-13 (×2): 3 mL via INTRAVENOUS

## 2013-01-10 MED ORDER — MORPHINE SULFATE 4 MG/ML IJ SOLN
4.0000 mg | INTRAMUSCULAR | Status: AC
  Administered 2013-01-10: 4 mg via INTRAVENOUS

## 2013-01-10 MED ORDER — HEPARIN (PORCINE) IN NACL 100-0.45 UNIT/ML-% IJ SOLN
2200.0000 [IU]/h | INTRAMUSCULAR | Status: DC
  Administered 2013-01-10 (×2): 1300 [IU]/h via INTRAVENOUS
  Administered 2013-01-11: 1600 [IU]/h via INTRAVENOUS
  Administered 2013-01-11: 1900 [IU]/h via INTRAVENOUS
  Administered 2013-01-11: 1750 [IU]/h via INTRAVENOUS
  Administered 2013-01-12: 2200 [IU]/h via INTRAVENOUS
  Filled 2013-01-10 (×6): qty 250

## 2013-01-10 MED ORDER — NAPROXEN 500 MG PO TABS
500.0000 mg | ORAL_TABLET | Freq: Two times a day (BID) | ORAL | Status: DC
  Administered 2013-01-10 – 2013-01-13 (×6): 500 mg via ORAL
  Filled 2013-01-10 (×9): qty 1

## 2013-01-10 MED ORDER — OXYCODONE-ACETAMINOPHEN 5-325 MG PO TABS
1.0000 | ORAL_TABLET | ORAL | Status: DC | PRN
  Administered 2013-01-10 – 2013-01-13 (×13): 2 via ORAL
  Filled 2013-01-10 (×13): qty 2

## 2013-01-10 MED ORDER — BUPROPION HCL 75 MG PO TABS: 75.0000 mg | ORAL_TABLET | Freq: Every day | ORAL | Status: DC

## 2013-01-10 MED ORDER — SODIUM CHLORIDE 0.9 % IV SOLN: 250.0000 mL | INTRAVENOUS | Status: DC | PRN

## 2013-01-10 MED ORDER — IOHEXOL 350 MG/ML SOLN
100.0000 mL | Freq: Once | INTRAVENOUS | Status: AC | PRN
  Administered 2013-01-10: 100 mL via INTRAVENOUS

## 2013-01-10 MED ORDER — DIAZEPAM 5 MG PO TABS
5.0000 mg | ORAL_TABLET | Freq: Four times a day (QID) | ORAL | Status: DC | PRN
  Administered 2013-01-10 – 2013-01-13 (×10): 5 mg via ORAL
  Filled 2013-01-10 (×10): qty 1

## 2013-01-10 MED ORDER — SODIUM CHLORIDE 0.9 % IJ SOLN
3.0000 mL | INTRAMUSCULAR | Status: DC | PRN
  Administered 2013-01-13: 3 mL via INTRAVENOUS

## 2013-01-10 MED ORDER — MORPHINE SULFATE 4 MG/ML IJ SOLN
4.0000 mg | INTRAMUSCULAR | Status: DC | PRN
  Administered 2013-01-10: 4 mg via INTRAVENOUS
  Filled 2013-01-10: qty 1

## 2013-01-10 MED ORDER — MORPHINE SULFATE 2 MG/ML IJ SOLN: 2.0000 mg | INTRAMUSCULAR | Status: DC | PRN

## 2013-01-10 MED ORDER — SODIUM CHLORIDE 0.9 % IJ SOLN
3.0000 mL | Freq: Two times a day (BID) | INTRAMUSCULAR | Status: DC
  Administered 2013-01-10: 3 mL via INTRAVENOUS

## 2013-01-10 NOTE — Progress Notes (Signed)
Utilization Review Completed.Anthony Berg T12/02/2012

## 2013-01-10 NOTE — Progress Notes (Signed)
Pt HR noted to be sustaining in the 130's; Dr Glendell Docker paged and came to bedside to assess; Dr. Glendell Docker ordered a EKG stat which was obtained by the bedside RN; no new orders; will continue to monitor.

## 2013-01-10 NOTE — ED Notes (Signed)
Pt eating lunch tray  

## 2013-01-10 NOTE — ED Provider Notes (Signed)
I received call concerning large PE in this patient He is currently stable, he is on 2L oxygen.  He is hemodynamically stable He denies any recent h/o GI bleed D/w internal medicine will place on stepdown unit D/w dr Lovell Sheehan, his surgeon, ok for anticoagulation 2 months after lumbar fusion  Joya Gaskins, MD 01/10/13 [phone]

## 2013-01-10 NOTE — ED Notes (Signed)
Pt en route to Radiology for CT.

## 2013-01-10 NOTE — ED Notes (Signed)
Patient reports cough x1 week. Pt states he started developing R sided CP today. Pain does radiate down to his abdomen. Pt reports his pain increases severely when he coughs. Wife reports at home his O2 sats were 84% on RA. 88% on RA in ED. Ax4, NAD. Some perspiration noted at rest.

## 2013-01-10 NOTE — Progress Notes (Signed)
Pt arrived to 2C03 from ED; Pt alert & oriented x4; PIV in use in Right AC; pt reports pain 8 out of 10; Pt attached to tele; will continue to monitor.

## 2013-01-10 NOTE — ED Notes (Signed)
Pt in route to radiology.

## 2013-01-10 NOTE — Progress Notes (Signed)
ANTICOAGULATION CONSULT NOTE   Pharmacy Consult for heparin Indication: pulmonary embolus  No Known Allergies  Patient Measurements: Height: 5\' 11"  (180.3 cm) Weight: 202 lb 13.2 oz (92 kg) IBW/kg (Calculated) : 75.3 Heparin Dosing Weight: 80  Vital Signs: Temp: 98.6 F (37 C) (12/01 1652) Temp src: Oral (12/01 1652) BP: 138/94 mmHg (12/01 1652) Pulse Rate: 120 (12/01 1652)  Labs:  Recent Labs  01/10/13 0324 01/10/13 0558 01/10/13 0758 01/10/13 1150 01/10/13 1545  HGB 14.0  --   --   --   --   HCT 41.8  --   --   --   --   PLT 193  --   --   --   --   APTT  --   --  36  --   --   LABPROT  --   --  13.5  --   --   INR  --   --  1.05  --   --   HEPARINUNFRC  --   --   --  0.49 <0.10*  CREATININE 0.74  --   --   --   --   TROPONINI  --  <0.30  --  <0.30  --     Estimated Creatinine Clearance: 131 ml/min (by C-G formula based on Cr of 0.74).   Medical History: Past Medical History  Diagnosis Date  . Hypertension   . Depression   . Low back pain potentially associated with radiculopathy   . Anxiety     Medications:  See med rec  Assessment: 47 yo man on heparin for PE.  His CT demonstrates submassive PE with evidence of right heart strain.  Initial heparin level therapeutic on 1300 units/hr although was drawn a little early.  Repeat level < 0.1.  Spoke with RN, she says patient is having trouble with IV, and has not been running well this afternoon.  IV team was just able to obtain second IV site, and heparin gtt will be switched to this site.  Goal of Therapy:  Heparin level 0.3-0.7 units/ml Monitor platelets by anticoagulation protocol: Yes   Plan:  Rebolus Heparin 5000 units x 1 now through new IV site.  Then resume drip at 1300 units/hr Check heparin level 6 hours after restart. Daily heparin level and CBC while on heparin.  Tad Moore, BCPS  Clinical Pharmacist Pager [phone]  01/10/2013 5:16 PM

## 2013-01-10 NOTE — H&P (Signed)
Date: 01/10/2013               Patient Name:  Anthony Berg MRN: [id number]  DOB: 09-Sep-1965 Age / Sex: 47 y.o., male   PCP: Aida Puffer, MD         Medical Service: Internal Medicine Teaching Service         Attending Physician: Dr. Aletta Edouard, MD    First Contact: Dr. Glendell Docker Pager: [phone]  Second Contact: Dr. Garald Braver Pager: [phone]       After Hours (After 5p/  First Contact Pager: [phone]  weekends / holidays): Second Contact Pager: [phone]   Chief Complaint: Cough, shortness of breath  History of Present Illness:  Anthony Berg is a 47 yo man with history of HTN & lumbago who presents on 01/10/13 to [facility] with complaints of worsening bloody cough with shortness of breath at rest since Saturday (2 days prior to admission).  In addition to cough/SOB, he reports 8/10 (at worst), non-radiating, right thoracic mid axillary crampy pain, that is sharp with inspiration and worse with movement. He reports that on Wednesday, he was working in the yard without symptoms and was subsequently sore. On Thursday he drove to [facility] and back, but then stayed in bed for the majority of the time Friday - Sunday.  No other recent travel.  He tried soma, xanax, and percocet with some relief (4-5/10).  No similar symptoms in the past.  He has noticed increased swelling of his right leg recently (though he has had chronic intermittent swelling of his right knee since his knee surgery in 2000). He was prompted to come to the ED by his fiance Tresa Endo) who is a nurse secondary to his pain, cough and SOB.    Of note, he did have a lumbar decompression and fusion in October, but has been very active since then.   Meds: Current Outpatient Prescriptions  Medication Sig Dispense Refill  . bisoprolol-hydrochlorothiazide (ZIAC) 2.5-6.25 MG per tablet Take 1 tablet by mouth daily.      Marland Kitchen buPROPion (WELLBUTRIN) 75 MG tablet Take 75 mg by mouth 2 (two) times daily.      . carisoprodol (SOMA) 350 MG  tablet Take 350 mg by mouth 2 (two) times daily.      . diazepam (VALIUM) 5 MG tablet Take 1-2 tablets (5-10 mg total) by mouth every 6 (six) hours as needed.  60 tablet  0  . HYDROcodone-acetaminophen (NORCO) 10-325 MG per tablet Take 1 tablet by mouth every 6 (six) hours as needed for pain.      Marland Kitchen oxyCODONE-acetaminophen (PERCOCET/ROXICET) 5-325 MG per tablet Take 1-2 tablets by mouth every 4 (four) hours as needed.  80 tablet  0    Allergies: Allergies as of 01/10/2013  . (No Known Allergies)   Past Medical History  Diagnosis Date  . Hypertension   . Depression   . Low back pain potentially associated with radiculopathy   . Anxiety    Past Surgical History  Procedure Laterality Date  . 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 Tonsillectomy    . Lumbar fusion  11/11/12    L5-S1 decompresion and fusion by Dr. Lovell Sheehan   Family History  Problem Relation Age of Onset  . Heart disease Father   . Heart disease Brother   . Diabetes Brother   . Heart disease Brother   . Colon cancer Father  History   Social History  . Marital Status: Single, engaged Tresa Endo)    Spouse Name: N/A    Number of Children: 1  . Years of Education: college   Social History Main Topics  . Smoking status: Current Every Day Smoker -- 1.00 packs/day for 8 years    Types: Cigarettes  . Smokeless tobacco: Never Used     Comment: smoking cessation discussed  . Alcohol Use: 0.6 oz/week    1 Glasses of wine, 4-5 Cans of beer per week  . Drug Use: No  . 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 Dropped out of [employer] after getting a job offer.    Review of Systems: Constitutional: Denies diaphoresis, appetite change and fatigue. Reported fever of 101 at home yesterday HEENT: Denies photophobia, eye pain, redness,  hearing loss, ear pain, congestion, sore throat, rhinorrhea, sneezing, mouth sores, trouble swallowing, neck pain, neck stiffness and tinnitus.  Respiratory: Denies cough and wheezing.  Cardiovascular: Denies palpitations  Gastrointestinal: Denies nausea, vomiting, abdominal pain, diarrhea, constipation,blood in stool and abdominal distention.  Genitourinary: Denies dysuria, urgency, frequency, hematuria, flank pain and difficulty urinating.  Musculoskeletal: chronic arthritic pain in back Skin: Denies pallor, rash and wound.  Neurological: Denies dizziness, seizures, syncope, weakness, lightheadedness, numbness     Physical Exam: Blood pressure 125/81, pulse 96, temperature 98.4 F (36.9 C), temperature source Oral, resp. rate 18, height 5\' 11"  (1.803 m), weight 200 lb (90.719 kg), SpO2 94.00%. General: resting in bed, no acute distress, appears as stated age HEENT: PERRL, EOMI, no scleral icterus Cardiac: RRR, no rubs, murmurs or gallops Pulm: soft bibasilar rales without wheezing Abd: soft, nontender, nondistended, BS normoactive, abdominal binder in place Ext/Back: warm and well perfused, left calf larger than right (~2cm) with petechial rash; well healed scar from lumbar back surgery   Neuro: alert and oriented X3, cranial nerves II-XII grossly intact, strength 5/5 b/l UE & LE  Lab results: Basic Metabolic Panel:  Recent Labs  78/29/56 0324  NA 134*  K 3.7  CL 96  CO2 26  GLUCOSE 157*  BUN 9  CREATININE 0.74  CALCIUM 9.0  AG 12  CBC:  Recent Labs  01/10/13 0324  WBC 13.8*  NEUTROABS 10.1*  HGB 14.0  HCT 41.8  MCV 95.7  PLT 193   Cardiac Enzymes:  Recent Labs  01/10/13 0558  TROPONINI <0.30    Imaging results:  Dg Chest 2 View 01/10/2013   Comparison: 11/05/1938  Findings: Mild lung base opacities. Linear areas of atelectasis. Cardiomediastinal contours within normal range for mild hypoaeration.  No pleural effusion or pneumothorax.  Multilevel  degenerative change without acute osseous finding.  IMPRESSION: Linear and lung base opacities, favor atelectasis.   Original Report Authenticated By: Jearld Lesch, M.D.   Ct Angio Chest Pe W/cm &/or Wo Cm 01/10/2013   CLINICAL DATA:  Hypoxia and chest pain.  FINDINGS: Examination demonstrates moderate thrombus over the distal right main pulmonary artery and proximal right upper, middle and lower lobar pulmonary arteries. There is evidence of small left lower lobar pulmonary emboli. Lungs are adequately inflated and demonstrate opacification over the lower lobes right worse than left and lingula. There is a wedge-shaped peripheral opacity over the posterior lateral right lower lobe likely infarction. There is mild cardiomegaly. RV/ LV ratio is 4.6/3.9 which is abnormal in compatible with right heart strain and intermediate  risk/ submassive pulmonary embolus.  Remaining mediastinal structures are unremarkable.  Images through the upper abdomen demonstrate a 2.2 cm simple cystic structure above the left kidney and adjacent the spleen likely an exophytic left renal cyst, however not completely evaluated on this exam. Bones and soft tissues are unremarkable.  Review of the MIP images confirms the above findings.   IMPRESSION: Moderate a large volume pulmonary emboli right worse than left as described. Evidence of right heart strain compatible intermediate risk/submassive pulmonary embolism. Evidence of associated bibasilar airspace opacification likely infarction/atelectasis.  2.2 cm simple cystic structure in the left upper quadrant likely a renal cyst but incompletely evaluated on this exam.  Critical Value/emergent results were called by telephone at the time of interpretation on 01/10/2013 at 7:40 AM to Dr.Wickline, who verbally acknowledged these results.   Electronically Signed   By: Elberta Fortis M.D.   On: 01/10/2013 07:42   Other results: EKG: Rate 112, sinus, regular, left axis deviation, no S1Q3T3,  no ST/T wave changes; no significant changes from prior EKG except rate   Assessment & Plan by Problem: Mr. Bufkin is a 47 yo M with h/o HTN and lumbago admitted on 01/10/13 with c/o cough, SOB and chest pain.  # Bilateral pulmonary embolism: Stable, early presentation. PE severity index 98 (Class III) Evidenced by CT chest, requiring 2L O2 (88% on RA upon arrival). LLE with mild swelling and petichiae, but ultrasound would not change mgmt at this time.  Unclear etiology - history of recent immobilization x 2d, but did get up to walk dog/use restroom. History of back surgery 2 months ago, but was not immobile for a significant period of time.  Other risk factors include HTN & smoking. No family or personal hx of PE/DVT. CBC not suggestive of myeloproliferative disorders.  No history of renal disease to suggest nephrotic syndrome. No history of liver disease.   -Admit to inpatient, observe in SDU x 24h, then consider tele -Heparin gtt per pharmacy, monitor stability then plan to transition to lovenox tomorrow  -Troponin x 2 (initial negative, no EKG changes) to evaluate heart strain -2D echo as RV dysfunction may help predict recurrent PE & also predicts increased mortality -CBC in AM (no s/s of anemia/blood loss) -Pain mgmt with home regimen + anti-inflammatories (naproxen 500 BID) -if he develops hemodynamic instability, may need to consider thrombolytics -Early for cancer screening, but given h/o father with colon cancer, consider outpatient colonoscopy -Testing for hypercoagulability would not change mgmt at this time, will hold off given no validated guidelines (not a recurrent VTE, not in an unusual site, no strong family history)  # Hypertension: Stable.  No s/s of hypotension since arrival to ED. Given submassive PE, will hold bisoprolol-HCTZ -BMET on 01/12/13 to monitor kidney function since given contrast and high dose NSAIDs   # Spondylolisthesis of lumbar region: Stable, s/p  decompression & fusion 2 months prior -Dr. Bebe Shaggy spoke with Dr. Lovell Sheehan, ok for anticoagulation  -Cont percocet & valium to avoid withdrawal (with hold parameters), will hold soma to avoid hypotension  # Depression: Stable, admits to anxiety. -Continue wellbutrin and valium  VTE ppx: heparin gtt   Dispo: Disposition is deferred at this time, awaiting improvement of current medical problems. Anticipated discharge in approximately 2-3 day(s).   The patient does have a current PCP Aida Puffer, MD) and does not need an [facility] hospital follow-up appointment after discharge.  The patient does not have transportation limitations that hinder transportation to clinic appointments.  Signed:  Belia Heman, MD 01/10/2013, 8:09 AM

## 2013-01-10 NOTE — Progress Notes (Signed)
Pt agitated and in pain; Pt states "he is going to leave unless he gets something for pain"; Dr Glendell Docker paged and notified of pt complaints; MD instructed RN to give the Valium 5mg  early and that he would order IV Morphine for his pain; RN expressed desire for the MD to come give the patient an update on his status; will continue to monitor.

## 2013-01-10 NOTE — ED Notes (Signed)
Pt back from radiology 

## 2013-01-10 NOTE — Progress Notes (Signed)
ANTICOAGULATION CONSULT NOTE - Initial Consult  Pharmacy Consult for heparin Indication: pulmonary embolus  No Known Allergies  Patient Measurements: Height: 5\' 11"  (180.3 cm) Weight: 200 lb (90.719 kg) IBW/kg (Calculated) : 75.3 Heparin Dosing Weight: 80  Vital Signs: Temp: 98.4 F (36.9 C) (12/01 0713) Temp src: Oral (12/01 0304) BP: 125/81 mmHg (12/01 0713) Pulse Rate: 96 (12/01 0713)  Labs:  Recent Labs  01/10/13 0324 01/10/13 0558  HGB 14.0  --   HCT 41.8  --   PLT 193  --   CREATININE 0.74  --   TROPONINI  --  <0.30    Estimated Creatinine Clearance: 130.2 ml/min (by C-G formula based on Cr of 0.74).   Medical History: Past Medical History  Diagnosis Date  . Hypertension   . Depression   . Low back pain potentially associated with radiculopathy   . Anxiety     Medications:  See med rec  Assessment: 47 yo man to start heparin for PE.  His CT demonstrates submassive PE with evidence of right heart strain.  Goal of Therapy:  Heparin level 0.3-0.7 units/ml Monitor platelets by anticoagulation protocol: Yes   Plan:  Heparin bolus 5000 units and drip at 1300 units/hr Check heparin level 6 hours after start. Daily heparin level and CBC while on heparin.  Anthony Berg 01/10/2013,8:00 AM

## 2013-01-10 NOTE — ED Notes (Signed)
Pt has reported some pain bilaterally on the posterior portion of his knees.

## 2013-01-10 NOTE — ED Notes (Signed)
Pt was given 2L of O2 with [place]. Oxygen went up to 97%.

## 2013-01-10 NOTE — ED Notes (Signed)
Admitting MD at bedside.

## 2013-01-10 NOTE — Progress Notes (Signed)
  Echocardiogram 2D Echocardiogram has been performed.  Jorje Guild 01/10/2013, 2:41 PM

## 2013-01-10 NOTE — Consult Note (Signed)
  The patient is a 47 year old white male who is well-known to me. He is approximately 2 months status post a lumbar decompression and fusion. He has been admitted for a pulmonary embolism. I was asked to comment on whether the patient could be anticoagulated. At this point given his pulmonary embolism and the fact that is 2 months status post surgery I think the benefits of anticoagulation outweighed the risk in regards to his recent spine surgery. Please call if I can be of further assistance.

## 2013-01-10 NOTE — ED Notes (Signed)
Pt waiting in ER for bed placement. Pt reports back and cp, medicated with admission orders. Pt has back brace in place. Is watching TV in bed, is alert and oriented. Will order patient lunch tray.

## 2013-01-10 NOTE — ED Provider Notes (Signed)
CSN: [id number]     Arrival date & time 01/10/13  0257 History   First MD Initiated Contact with Patient 01/10/13 [phone]     Chief Complaint  Patient presents with  . Chest Pain  . Shortness of Breath   (Consider location/radiation/quality/duration/timing/severity/associated sxs/prior Treatment) HPI 47 year old male presents emergency department with complaint of cough, shortness of breath, and fever.  Patient, reports he's had cough, ongoing for the past week.  Over the last 3-4 days he has had increasing episodes of shortness of breath.  He attributed to over doing things over the holidays.  Tonight when symptoms were not improving he decided to come to the emergency department.  Patient noted be tachycardic and hypoxic upon arrival.  Patient is a smoker.  Patient had lumbar fusion done in October.  He, reports he's been doing fairly well since that time.  He has had fevers to 101 at home as recently as yesterday.  He has right-sided chest pain that is worse with deep breathing and cough.  He denies any leg swelling.  No history of PE or DVT. Past Medical History  Diagnosis Date  . Hypertension   . Depression   . Low back pain potentially associated with radiculopathy   . Anxiety    Past Surgical History  Procedure Laterality Date  . Knee surgery  2000-2002    hx of 4 knee surgeries  . Tonsillectomy     Family History  Problem Relation Age of Onset  . Heart disease Father   . Heart disease Brother    History  Substance Use Topics  . Smoking status: Current Every Day Smoker -- 1.00 packs/day for 8 years    Types: Cigarettes  . Smokeless tobacco: Never Used     Comment: smoking cessation discussed  . Alcohol Use: 0.6 oz/week    1 Glasses of wine, 4-5 Cans of beer per week    Review of Systems  See History of Present Illness; otherwise all other systems are reviewed and negative Allergies  Review of patient's allergies indicates no known allergies.  Home Medications    Current Outpatient Rx  Name  Route  Sig  Dispense  Refill  . bisoprolol-hydrochlorothiazide (ZIAC) 2.5-6.25 MG per tablet   Oral   Take 1 tablet by mouth daily.         Marland Kitchen buPROPion (WELLBUTRIN) 75 MG tablet   Oral   Take 75 mg by mouth 2 (two) times daily.         . carisoprodol (SOMA) 350 MG tablet   Oral   Take 350 mg by mouth 2 (two) times daily.         . diazepam (VALIUM) 5 MG tablet   Oral   Take 1-2 tablets (5-10 mg total) by mouth every 6 (six) hours as needed.   60 tablet   0   . HYDROcodone-acetaminophen (NORCO) 10-325 MG per tablet   Oral   Take 1 tablet by mouth every 6 (six) hours as needed for pain.         Marland Kitchen oxyCODONE-acetaminophen (PERCOCET/ROXICET) 5-325 MG per tablet   Oral   Take 1-2 tablets by mouth every 4 (four) hours as needed.   80 tablet   0    BP 119/88  Pulse 107  Temp(Src) 99 F (37.2 C) (Oral)  Resp 19  SpO2 94% Physical Exam  Nursing note and vitals reviewed. Constitutional: He is oriented to person, place, and time. He appears well-developed and well-nourished. He appears distressed (  uncomfortable appearing).  HENT:  Head: Normocephalic and atraumatic.  Right Ear: External ear normal.  Left Ear: External ear normal.  Nose: Nose normal.  Mouth/Throat: Oropharynx is clear and moist.  Eyes: Conjunctivae and EOM are normal. Pupils are equal, round, and reactive to light.  Neck: Normal range of motion. Neck supple. No JVD present. No tracheal deviation present. No thyromegaly present.  Cardiovascular: Normal rate, regular rhythm, normal heart sounds and intact distal pulses.  Exam reveals no gallop and no friction rub.   No murmur heard. Pulmonary/Chest: No stridor. No respiratory distress. He has no wheezes. He has no rales. He exhibits no tenderness.  Decreased breath sounds throughout  Abdominal: Soft. Bowel sounds are normal. He exhibits no distension and no mass. There is no tenderness. There is no rebound and no guarding.   Musculoskeletal: Normal range of motion. He exhibits no edema and no tenderness.  Lymphadenopathy:    He has no cervical adenopathy.  Neurological: He is alert and oriented to person, place, and time. He exhibits normal muscle tone. Coordination normal.  Skin: Skin is warm and dry. No rash noted. No erythema. No pallor.  Psychiatric: He has a normal mood and affect. His behavior is normal. Judgment and thought content normal.    ED Course  Procedures (including critical care time) Labs Review Labs Reviewed  CBC WITH DIFFERENTIAL - Abnormal; Notable for the following:    WBC 13.8 (*)    Neutro Abs 10.1 (*)    Lymphocytes Relative 11 (*)    Monocytes Relative 14 (*)    Monocytes Absolute 1.9 (*)    All other components within normal limits  BASIC METABOLIC PANEL - Abnormal; Notable for the following:    Sodium 134 (*)    Glucose, Bld 157 (*)    All other components within normal limits  TROPONIN I   Imaging Review No results found.  EKG Interpretation    Date/Time:  Monday January 10 2013 03:06:10 EST Ventricular Rate:  112 PR Interval:  176 QRS Duration: 88 QT Interval:  331 QTC Calculation: 452 R Axis:   11 Text Interpretation:  Sinus tachycardia Probable left atrial enlargement Abnormal R-wave progression, early transition Left ventricular hypertrophy Since last tracing rate faster Confirmed by Myshawn Chiriboga  MD, Halaina Vanduzer ([id number]) on 01/10/2013 3:11:57 AM            MDM  No diagnosis found. 47 year old male with a week of cough, now with fevers, and hypoxia.  Concern for kidney acquired pneumonia.  Other differential includes PE.  Labs and chest x-ray ordered.  Patient improved to 97% on 2 L O2.  Given his initial hypoxia, and expect admission.  Care passed to Dr Bebe Shaggy at change of shift awaiting CTA chest.  No pna seen on cxr.  Pt and family updated on findings thus far and plan of care.  Will need admission for hypoxia.  Ddx includes pna, pe.    Olivia Mackie,  MD 01/10/13 [phone]

## 2013-01-10 NOTE — ED Notes (Signed)
Ordered Heart Healthy tray 

## 2013-01-11 DIAGNOSIS — F329 Major depressive disorder, single episode, unspecified: Secondary | ICD-10-CM

## 2013-01-11 LAB — CBC
Platelets: 192 10*3/uL (ref 150–400)
RBC: 3.96 MIL/uL — ABNORMAL LOW (ref 4.22–5.81)
RDW: 12.6 % (ref 11.5–15.5)
WBC: 10.5 10*3/uL (ref 4.0–10.5)

## 2013-01-11 LAB — HEPARIN LEVEL (UNFRACTIONATED)
Heparin Unfractionated: 0.16 IU/mL — ABNORMAL LOW (ref 0.30–0.70)
Heparin Unfractionated: 0.26 IU/mL — ABNORMAL LOW (ref 0.30–0.70)
Heparin Unfractionated: 0.34 IU/mL (ref 0.30–0.70)

## 2013-01-11 MED ORDER — HEPARIN BOLUS VIA INFUSION
3000.0000 [IU] | Freq: Once | INTRAVENOUS | Status: AC
  Administered 2013-01-11: 3000 [IU] via INTRAVENOUS
  Filled 2013-01-11: qty 3000

## 2013-01-11 MED ORDER — HEPARIN BOLUS VIA INFUSION
1200.0000 [IU] | Freq: Once | INTRAVENOUS | Status: AC
Start: 2013-01-11 — End: 2013-01-11
  Administered 2013-01-11: 1200 [IU] via INTRAVENOUS
  Filled 2013-01-11: qty 1200

## 2013-01-11 MED ORDER — HYDROMORPHONE HCL PF 1 MG/ML IJ SOLN
1.0000 mg | INTRAMUSCULAR | Status: DC | PRN
  Administered 2013-01-11: 1 mg via INTRAVENOUS
  Filled 2013-01-11: qty 1

## 2013-01-11 NOTE — Progress Notes (Signed)
ANTICOAGULATION CONSULT NOTE - Follow Up Consult  Pharmacy Consult for heparin Indication: pulmonary embolus  No Known Allergies  Patient Measurements: Height: 5\' 11"  (180.3 cm) Weight: 203 lb 7.8 oz (92.3 kg) IBW/kg (Calculated) : 75.3 Heparin Dosing Weight: 80 kg  Vital Signs: Temp: 98.2 F (36.8 C) (12/02 0415) Temp src: Oral (12/02 0415) BP: 129/68 mmHg (12/02 0724) Pulse Rate: 98 (12/02 0724)  Labs:  Recent Labs  01/10/13 0324 01/10/13 0558 01/10/13 0758 01/10/13 1150 01/10/13 1545 01/10/13 1825 01/11/13  HGB 14.0  --   --   --   --   --   --   HCT 41.8  --   --   --   --   --   --   PLT 193  --   --   --   --   --   --   APTT  --   --  36  --   --   --   --   LABPROT  --   --  13.5  --   --   --   --   INR  --   --  1.05  --   --   --   --   HEPARINUNFRC  --   --   --  0.49 <0.10*  --  0.16*  CREATININE 0.74  --   --   --   --   --   --   TROPONINI  --  <0.30  --  <0.30  --  <0.30  --     Estimated Creatinine Clearance: 131.1 ml/min (by C-G formula based on Cr of 0.74).   Medications:  Scheduled:  . naproxen  500 mg Oral BID WC  . sodium chloride  3 mL Intravenous Q12H  . sodium chloride  3 mL Intravenous Q12H   Infusions:  . heparin 1,600 Units/hr (01/11/13 0700)    Assessment: 47 yo M on heparin gtt for PE.  His CT demonstrates submassive PE with evidence of right heart strain.  Early this morning, a heparin level returned subtherapeutic at 0.16, and a 3000 unit bolus was given followed by a rate increase to 1600 units/hr. The 6 hour follow up level has resulted still subtherapeutic at 0.26.  Patient's kidney function remains good (CrCl >120).  No bleeding issues have been noted.  Goal of Therapy:  Heparin level 0.3-0.7 units/ml Monitor platelets by anticoagulation protocol: Yes   Plan:  - give heparin IV bolus of 1200 units, followed by a rate increase to 1750 units/hr - draw 6 hour HL - daily HL and CBC  - monitor for s/s of  bleeding  Harrold Donath E. Achilles Dunk, PharmD Clinical Pharmacist - Resident Pager: [phone] Pharmacy: [phone] 01/11/2013 8:26 AM

## 2013-01-11 NOTE — Progress Notes (Signed)
Subjective: Anthony Berg is a 47 y.o. man presenting with a PE.  He did well overnight with improved control of his chest pain. His tachycardia has decreased from 140 to 100's. O2 sats stable on 4 L.    Objective: Vital signs in last 24 hours: Filed Vitals:   01/11/13 0415 01/11/13 0419 01/11/13 0724 01/11/13 0838  BP: 129/77  129/68 118/62  Pulse: 102  98 94  Temp: 98.2 F (36.8 C)   97.8 F (36.6 C)  TempSrc: Oral   Oral  Resp: 16  15 13   Height:      Weight:  203 lb 7.8 oz (92.3 kg)    SpO2: 95%  96% 95%   Weight change:   Intake/Output Summary (Last 24 hours) at 01/11/13 0949 Last data filed at 01/11/13 0700  Gross per 24 hour  Intake 360.33 ml  Output    850 ml  Net -489.67 ml   General: resting in bed, no acute distress, appears as stated age  HEENT: PERRL, EOMI, no scleral icterus  Cardiac: RRR, no rubs, murmurs or gallops, tachycardic,  Pulm: soft bibasilar rales without wheezing, Abd: soft, nontender, nondistended, BS normoactive, abdominal binder in place  Ext/Back: warm and well perfused, left calf larger than right (~2cm) with petechial rash; well healed scar from lumbar back surgery  Neuro: alert and oriented X3, cranial nerves II-XII grossly intact, strength 5/5 b/l UE & LE   Lab Results: Basic Metabolic Panel:  Recent Labs Lab 01/10/13 0324  NA 134*  K 3.7  CL 96  CO2 26  GLUCOSE 157*  BUN 9  CREATININE 0.74  CALCIUM 9.0   Liver Function Tests:  Recent Labs Lab 01/10/13 1150  AST 16  ALT 16  ALKPHOS 76  BILITOT 0.8  PROT 7.6  ALBUMIN 3.3*   No results found for this basename: LIPASE, AMYLASE,  in the last 168 hours No results found for this basename: AMMONIA,  in the last 168 hours CBC:  Recent Labs Lab 01/10/13 0324 01/11/13 0805  WBC 13.8* 10.5  NEUTROABS 10.1*  --   HGB 14.0 12.4*  HCT 41.8 37.9*  MCV 95.7 95.7  PLT 193 192   Cardiac Enzymes:  Recent Labs Lab 01/10/13 0558 01/10/13 1150 01/10/13 1825    TROPONINI <0.30 <0.30 <0.30   Coagulation:  Recent Labs Lab 01/10/13 0758  LABPROT 13.5  INR 1.05   Studies/Results: Dg Chest 2 View  01/10/2013   *RADIOLOGY REPORT*  Clinical Data: Chest pain, cough  CHEST - 2 VIEW  Comparison: 11/05/1939  Findings: Mild lung base opacities. Linear areas of atelectasis. Cardiomediastinal contours within normal range for mild hypoaeration.  No pleural effusion or pneumothorax.  Multilevel degenerative change without acute osseous finding.  IMPRESSION: Linear and lung base opacities, favor atelectasis.   Original Report Authenticated By: Jearld Lesch, M.D.   Ct Angio Chest Pe W/cm &/or Wo Cm  01/10/2013   ADDENDUM REPORT: 01/10/2013 10:42  ADDENDUM: RV/LV ratio is 4.6/3.9 = 1.2   Electronically Signed   By: Elberta Fortis M.D.   On: 01/10/2013 10:42   01/10/2013   CLINICAL DATA:  Hypoxia and chest pain. Rule out pulmonary embolism.  EXAM: CT ANGIOGRAPHY CHEST WITH CONTRAST  TECHNIQUE: Multidetector CT imaging of the chest was performed using the standard protocol during bolus administration of intravenous contrast. Multiplanar CT image reconstructions including MIPs were obtained to evaluate the vascular anatomy.  CONTRAST:  OMNIPAQUE IOHEXOL 350 MG/ML SOLN  COMPARISON:  Chest x-ray 01/10/2013  FINDINGS: Examination demonstrates moderate thrombus over the distal right main pulmonary artery and proximal right upper, middle and lower lobar pulmonary arteries. There is evidence of small left lower lobar pulmonary emboli. Lungs are adequately inflated and demonstrate opacification over the lower lobes right worse than left and lingula. There is a wedge-shaped peripheral opacity over the posterior lateral right lower lobe likely infarction. There is mild cardiomegaly. RV/ LV ratio is 4.6/3.9 which is abnormal in compatible with right heart strain and intermediate risk/ submassive pulmonary embolus.  Remaining mediastinal structures are unremarkable.  Images  through the upper abdomen demonstrate a 2.2 cm simple cystic structure above the left kidney and adjacent the spleen likely an exophytic left renal cyst, however not completely evaluated on this exam. Bones and soft tissues are unremarkable.  Review of the MIP images confirms the above findings.  IMPRESSION: Moderate a large volume pulmonary emboli right worse than left as described. Evidence of right heart strain compatible intermediate risk/submassive pulmonary embolism. Evidence of associated bibasilar airspace opacification likely infarction/atelectasis.  2.2 cm simple cystic structure in the left upper quadrant likely a renal cyst but incompletely evaluated on this exam.  Critical Value/emergent results were called by telephone at the time of interpretation on 01/10/2013 at 7:40 AM to Dr.Wickline, who verbally acknowledged these results.  Electronically Signed: By: Elberta Fortis M.D. On: 01/10/2013 07:42   Medications: I have reviewed the patient's current medications. Scheduled Meds: . naproxen  500 mg Oral BID WC  . sodium chloride  3 mL Intravenous Q12H  . sodium chloride  3 mL Intravenous Q12H   Continuous Infusions: . heparin 1,600 Units/hr (01/11/13 0700)   PRN Meds:.sodium chloride, diazepam, morphine injection, oxyCODONE-acetaminophen, sodium chloride Assessment/Plan: Principal Problem:   Bilateral pulmonary embolism Active Problems:   Spondylolisthesis of lumbar region   Hypertension   Hypoxia  Anthony Berg is a 47 yo M with h/o HTN and lumbago admitted on 01/10/13 with c/o cough, SOB and chest pain.   # Bilateral pulmonary embolism: Stable, early presentation. PE severity index 98 (Class III)  Evidenced by CT chest, requiring 4L O2 (88% on RA upon arrival). LLE with mild swelling and petichiae, but ultrasound would not change mgmt at this time. Unclear etiology - history of recent immobilization x 2d, but did get up to walk dog/use restroom. History of back surgery 2 months ago, but  was not immobile for a significant period of time. Other risk factors include HTN & smoking. No family or personal hx of PE/DVT. CBC not suggestive of myeloproliferative disorders. No history of renal disease to suggest nephrotic syndrome. No history of liver disease.  -Admit to inpatient, observe in SDU x 24h, then consider tele  -Heparin gtt per pharmacy, Patient was told about coumadin versus xarelto, patient will talk with family and make a decision -Troponin x 3 (negative, no EKG changes)  -2D echo demonstrated no RV strain -Pain mgmt with home regimen + morphine 4 mg q4 prn -if he develops hemodynamic instability, may need to consider thrombolytics  -Early for cancer screening, but given h/o father with colon cancer, consider outpatient colonoscopy  -Testing for hypercoagulability would not change mgmt at this time, will hold off given no validated guidelines (not a recurrent VTE, not in an unusual site, no strong family history)   # Hypertension: Stable.  No s/s of hypotension since arrival to ED. Given submassive PE, will hold bisoprolol-HCTZ   # Spondylolisthesis of lumbar region: Stable, s/p decompression & fusion  2 months prior  -Dr. Bebe Shaggy spoke with Dr. Lovell Sheehan, ok for anticoagulation  -Cont percocet & valium to avoid withdrawal (with hold parameters), will hold soma to avoid hypotension   # Depression: Stable, admits to anxiety.  -Continue wellbutrin and valium   Dispo: Disposition is deferred at this time, awaiting improvement of current medical problems.  Anticipated discharge in approximately 2 day(s).   The patient does have a current PCP Aida Puffer, MD) and does not need an [facility] hospital follow-up appointment after discharge.  The patient does not have transportation limitations that hinder transportation to clinic appointments.  .Services Needed at time of discharge: Y = Yes, Blank = No PT:   OT:   RN:   Equipment:   Other:     LOS: 1 day   Pleas Koch, MD 01/11/2013, 9:49 AM

## 2013-01-11 NOTE — Progress Notes (Signed)
ANTICOAGULATION CONSULT NOTE   Pharmacy Consult for heparin Indication: pulmonary embolus  No Known Allergies  Patient Measurements: Height: 5\' 11"  (180.3 cm) Weight: 202 lb 13.2 oz (92 kg) IBW/kg (Calculated) : 75.3 Heparin Dosing Weight: 90  Vital Signs: Temp: 98.5 F (36.9 C) (12/02 0003) Temp src: Oral (12/02 0003) BP: 100/68 mmHg (12/02 0003) Pulse Rate: 110 (12/02 0003)  Labs:  Recent Labs  01/10/13 0324 01/10/13 0558 01/10/13 0758 01/10/13 1150 01/10/13 1545 01/10/13 1825 01/11/13  HGB 14.0  --   --   --   --   --   --   HCT 41.8  --   --   --   --   --   --   PLT 193  --   --   --   --   --   --   APTT  --   --  36  --   --   --   --   LABPROT  --   --  13.5  --   --   --   --   INR  --   --  1.05  --   --   --   --   HEPARINUNFRC  --   --   --  0.49 <0.10*  --  0.16*  CREATININE 0.74  --   --   --   --   --   --   TROPONINI  --  <0.30  --  <0.30  --  <0.30  --     Estimated Creatinine Clearance: 131 ml/min (by C-G formula based on Cr of 0.74).   Assessment: 47 yo male with PE for heparin  Goal of Therapy:  Heparin level 0.3-0.7 units/ml Monitor platelets by anticoagulation protocol: Yes   Plan:  Heparin 3000 units IV bolus, then increase heparin 1600 units/hr Check heparin level in 6 hours.  Geannie Risen, PharmD, BCPS   01/11/2013 1:25 AM

## 2013-01-11 NOTE — Progress Notes (Signed)
Subjective: No acute events overnight.  Chest pain was better controlled with increased medication regimen.  Objective: Vital signs in last 24 hours: Filed Vitals:   01/11/13 0415 01/11/13 0419 01/11/13 0724 01/11/13 0838  BP: 129/77  129/68 118/62  Pulse: 102  98 94  Temp: 98.2 F (36.8 C)   97.8 F (36.6 C)  TempSrc: Oral   Oral  Resp: 16  15 13   Height:      Weight:  92.3 kg (203 lb 7.8 oz)    SpO2: 95%  96% 95%   Weight change:   Intake/Output Summary (Last 24 hours) at 01/11/13 1115 Last data filed at 01/11/13 0700  Gross per 24 hour  Intake 360.33 ml  Output    850 ml  Net -489.67 ml   BP 118/62   Pulse 94   Temp(Src) 97.8 F (36.6 C) (Oral)   Resp 13   Ht 5\' 11"  (1.803 m)   Wt 92.3 kg (203 lb 7.8 oz)   BMI 28.39 kg/m2   SpO2 95%  General Appearance:    Alert, cooperative, no distress, appears stated age  Head:    Normocephalic, without obvious abnormality, atraumatic  Nose:   Nares normal, septum midline, mucosa normal, no drainage    or sinus tenderness  Throat:   Lips, mucosa, and tongue normal  Back:     Symmetric, no curvature  Lungs:     Clear to auscultation bilaterally, respirations unlabored  Heart:    Regular rate and rhythm, S1 and S2 normal, no murmur, rub   or gallop  Abdomen:     Soft, non-tender, bowel sounds active all four quadrants,    no masses, no organomegaly  Extremities:   Extremities normal, atraumatic, no cyanosis or edema  Pulses:   2+ and symmetric all extremities  Skin:   Skin color, texture, turgor normal   Lab Results:  Micro Results: Recent Results (from the past 240 hour(s))  MRSA PCR SCREENING     Status: None   Collection Time    01/10/13  2:23 PM      Result Value Range Status   MRSA by PCR NEGATIVE  NEGATIVE Final   Comment:            The GeneXpert MRSA Assay (FDA     approved for NASAL specimens     only), is one component of a     comprehensive MRSA colonization     surveillance program. It is not     intended to  diagnose MRSA     infection nor to guide or     monitor treatment for     MRSA infections.   Studies/Results: Dg Chest 2 View  01/10/2013   *RADIOLOGY REPORT*  Clinical Data: Chest pain, cough  CHEST - 2 VIEW  Comparison: 11/05/1939  Findings: Mild lung base opacities. Linear areas of atelectasis. Cardiomediastinal contours within normal range for mild hypoaeration.  No pleural effusion or pneumothorax.  Multilevel degenerative change without acute osseous finding.  IMPRESSION: Linear and lung base opacities, favor atelectasis.   Original Report Authenticated By: Jearld Lesch, M.D.   Ct Angio Chest Pe W/cm &/or Wo Cm  01/10/2013   ADDENDUM REPORT: 01/10/2013 10:42  ADDENDUM: RV/LV ratio is 4.6/3.9 = 1.2   Electronically Signed   By: Elberta Fortis M.D.   On: 01/10/2013 10:42   01/10/2013   CLINICAL DATA:  Hypoxia and chest pain. Rule out pulmonary embolism.  EXAM: CT ANGIOGRAPHY CHEST WITH CONTRAST  TECHNIQUE: Multidetector CT imaging of the chest was performed using the standard protocol during bolus administration of intravenous contrast. Multiplanar CT image reconstructions including MIPs were obtained to evaluate the vascular anatomy.  CONTRAST:  OMNIPAQUE IOHEXOL 350 MG/ML SOLN  COMPARISON:  Chest x-ray 01/10/2013  FINDINGS: Examination demonstrates moderate thrombus over the distal right main pulmonary artery and proximal right upper, middle and lower lobar pulmonary arteries. There is evidence of small left lower lobar pulmonary emboli. Lungs are adequately inflated and demonstrate opacification over the lower lobes right worse than left and lingula. There is a wedge-shaped peripheral opacity over the posterior lateral right lower lobe likely infarction. There is mild cardiomegaly. RV/ LV ratio is 4.6/3.9 which is abnormal in compatible with right heart strain and intermediate risk/ submassive pulmonary embolus.  Remaining mediastinal structures are unremarkable.  Images through the upper  abdomen demonstrate a 2.2 cm simple cystic structure above the left kidney and adjacent the spleen likely an exophytic left renal cyst, however not completely evaluated on this exam. Bones and soft tissues are unremarkable.  Review of the MIP images confirms the above findings.  IMPRESSION: Moderate a large volume pulmonary emboli right worse than left as described. Evidence of right heart strain compatible intermediate risk/submassive pulmonary embolism. Evidence of associated bibasilar airspace opacification likely infarction/atelectasis.  2.2 cm simple cystic structure in the left upper quadrant likely a renal cyst but incompletely evaluated on this exam.  Critical Value/emergent results were called by telephone at the time of interpretation on 01/10/2013 at 7:40 AM to Dr.Wickline, who verbally acknowledged these results.  Electronically Signed: By: Elberta Fortis M.D. On: 01/10/2013 07:42   Medications:  I have reviewed the patient's current medications. Prior to Admission:  Prescriptions prior to admission  Medication Sig Dispense Refill   bisoprolol-hydrochlorothiazide (ZIAC) 2.5-6.25 MG per tablet Take 1 tablet by mouth daily.       carisoprodol (SOMA) 350 MG tablet Take 350 mg by mouth 2 (two) times daily.       diazepam (VALIUM) 5 MG tablet Take 1-2 tablets (5-10 mg total) by mouth every 6 (six) hours as needed.  60 tablet  0   oxyCODONE-acetaminophen (PERCOCET/ROXICET) 5-325 MG per tablet Take 1-2 tablets by mouth every 4 (four) hours as needed.  80 tablet  0   Scheduled:  naproxen  500 mg Oral BID WC   sodium chloride  3 mL Intravenous Q12H   sodium chloride  3 mL Intravenous Q12H   Continuous:  heparin 1,750 Units/hr (01/11/13 1048)   VWU:JWJXBJ chloride, diazepam, morphine injection, oxyCODONE-acetaminophen, sodium chloride Scheduled Meds:  naproxen  500 mg Oral BID WC   sodium chloride  3 mL Intravenous Q12H   sodium chloride  3 mL Intravenous Q12H   Continuous  Infusions:  heparin 1,750 Units/hr (01/11/13 1048)   PRN Meds:.sodium chloride, diazepam, morphine injection, oxyCODONE-acetaminophen, sodium chloride  Assessment/Plan by Problem: 1. Bilateral pulmonary embolism: Anthony Berg presents with an early, stable, PE, of severity index 98 (Class III), as per his chest CT.  Since hospitalization, he has required 4L O2 (satting 88% on RA upon arrival, 95% this morning). LLE with mild swelling and petichiae, but ultrasound would not change mgmt at this time. Etiology is unclear; it is unclear whether this is a provoked or unprovoked PE.  He has a history of recent immobilization of two days' duration two days prior to hospitalization, but he did get up to walk dog/use restroom. History of back surgery 2 months ago, but was  not immobile for a significant period of time; other risk factors include HTN & smoking. He denies any family or personal hx of PE/DVT and does not have any findings consistent with malignancy; his CBC is not suggestive of myeloproliferative disorders. - Admit to inpatient, SDU - Heparin gtt per pharmacy, convert to long-term anticoagulation.  He was educated about the options and will determine which one he wishes to pursue. - Troponin x 3 (negative, no EKG changes)  - 2D echo demonstrated no RV strain  - Pain mgmt with home regimen + IV dilaudid 1 mg qd - Consider thrombolytics if hemodynamic instability develops - Consider outpatient colonoscopy given family history and DVT - Testing for hypercoagulability would not change mgmt at this time, will hold off given no validated guidelines (not a recurrent VTE, not in an unusual site, no strong family history)   2. Hypertension: Stable.  - No signs or symptoms of hyper- or hypotension since arrival to ED. Given submassive PE, will hold bisoprolol-HCTZ   3. Spondylolisthesis of lumbar region: Stable, s/p L5-S1 decompression and fusion in October - Dr. Bebe Shaggy spoke with Dr. Lovell Sheehan who was  comfortable with anticoagulation  - Continue home percocet 5-325 oral, q4h PRN - Continue home valium to avoid withdrawal (with hold parameters) 5 mg oral, q6h PRN - Withhold soma to avoid hypotension   4. Depression: Stable, admits to anxiety.  -Continue wellbutrin and valium   This is a Psychologist, occupational Note.  The care of the patient was discussed with Dr. Glendell Docker and the assessment and plan formulated with their assistance.  Please see their attached note for official documentation of the daily encounter.   LOS: 1 day   Jule Ser, [place] 3 P: [phone] 01/11/2013, 11:15 AM

## 2013-01-11 NOTE — H&P (Signed)
I have discussed this case with my resident team and I agree with their assessment, plan and documentation.   Assessment:  This is a 47 year old white man with back surgery done in October 2014, with acute PE with no evident right heart strain. Other issues: HTN, lumbago.   Exam today:  The patient is doing better. He complains of pain in his chest which seems to be pleuritic in nature. His exam is significant for crackles in his right side upper lung, and some petechiae on the left calf. I could not appreciate swelling in his leg.    Plan:   Acute Pulmonary Embolism -  The patient is on heparin, and we will continue it for the day while he gets some time to ponder over his anticoagulation choices. He has been given information about Coumadin versus the newer options. Even though the patient has a history of recent surgery, he reports that he was walking around in about 2 weeks after surgery and felt fine for 4 weeks after that before this new chest pain started 2 days ago. This makes me think this PE might be unprovoked. We are trying to find more about the family history of this patient. Hypercoagulable work up should be done as a part of follow up.  Pain control - I would change his morphine to dilaudid to control his pain better.   Rest issues & plan per resident note.   Aletta Edouard MD MPH 01/11/2013 12:40 PM

## 2013-01-11 NOTE — Progress Notes (Signed)
PHARMACY CONSULT NOTE   Pharmacy Consult for :  Heparin Indication: pulmonary embolus  Dosing weight 80 kg  Labs:  Recent Labs  01/10/13 0324 01/11/13 0805 01/11/13 1552  HGB 14.0 12.4*  --   HCT 41.8 37.9*  --   PLT 193 192  --   INR  --   --   --   HEPARINUNFRC  --  0.26* 0.34  CREATININE 0.74  --   --    Estimated Creatinine Clearance: 131.1 ml/min (by C-G formula based on Cr of 0.74).  Medications:  Infusions:  . HEPARIN 1,750 Units/hr (01/11/13 1240)   Assessment:  47 y/o male on Heparin infusion for submassive PE with evidence of R-heart strain.  Heparin infusion rate 1750 units/hr.  Heparin level at low end of therapeutic range, 0.34 units/hr. No bleeding complications noted   Goal of Therapy:   Heparin level 0.3-0.7 units/ml   Plan:  1. Increase Heparin infusion to 1900 units/hr. Will Check Hep Level in 6 hours, 00:30 AM Daily Heparin Levels, CBC.  Monitor for bleeding complications   Laurena Bering, Pharm.D. 01/11/2013, 5:49 PM

## 2013-01-12 LAB — COMPREHENSIVE METABOLIC PANEL
AST: 15 U/L (ref 0–37)
Albumin: 2.9 g/dL — ABNORMAL LOW (ref 3.5–5.2)
Alkaline Phosphatase: 72 U/L (ref 39–117)
Chloride: 100 mEq/L (ref 96–112)
Creatinine, Ser: 0.6 mg/dL (ref 0.50–1.35)
GFR calc Af Amer: >90 mL/min (ref >90)
Potassium: 3.7 mEq/L (ref 3.5–5.1)
Total Bilirubin: 0.3 mg/dL (ref 0.3–1.2)
Total Protein: 7.1 g/dL (ref 6.0–8.3)

## 2013-01-12 LAB — HEPARIN LEVEL (UNFRACTIONATED): Heparin Unfractionated: 0.15 IU/mL — ABNORMAL LOW (ref 0.30–0.70)

## 2013-01-12 MED ORDER — RIVAROXABAN (XARELTO) EDUCATION KIT FOR DVT/PE PATIENTS
PACK | Freq: Once | Status: AC
  Administered 2013-01-12: 09:00:00
  Filled 2013-01-12: qty 1

## 2013-01-12 MED ORDER — HEPARIN BOLUS VIA INFUSION
3000.0000 [IU] | Freq: Once | INTRAVENOUS | Status: AC
  Administered 2013-01-12: 3000 [IU] via INTRAVENOUS
  Filled 2013-01-12: qty 3000

## 2013-01-12 MED ORDER — RIVAROXABAN 15 MG PO TABS
15.0000 mg | ORAL_TABLET | Freq: Two times a day (BID) | ORAL | Status: DC
Start: 2013-01-12 — End: 2013-01-13
  Administered 2013-01-12 – 2013-01-13 (×4): 15 mg via ORAL
  Filled 2013-01-12 (×6): qty 1

## 2013-01-12 MED ORDER — RIVAROXABAN 20 MG PO TABS: 20.0000 mg | ORAL_TABLET | Freq: Every day | ORAL | Status: DC

## 2013-01-12 MED ORDER — OXYCODONE HCL 5 MG PO TABS: 10.0000 mg | ORAL_TABLET | ORAL | Status: DC | PRN

## 2013-01-12 MED ORDER — OXYCODONE HCL 5 MG PO TABS
5.0000 mg | ORAL_TABLET | ORAL | Status: DC | PRN
  Administered 2013-01-13: 5 mg via ORAL
  Filled 2013-01-12: qty 1

## 2013-01-12 NOTE — Progress Notes (Signed)
Report called to Tara, RN

## 2013-01-12 NOTE — Progress Notes (Signed)
amb in hallway on room ir o2 saturation 93%.

## 2013-01-12 NOTE — Progress Notes (Signed)
I agree with med students note. Please see my note for additional information.  Angelina Sheriff, MD

## 2013-01-12 NOTE — Progress Notes (Signed)
Subjective: No acute events overnight.  He states that his pain was better controlled with increased medication regimen.  Objective: Vital signs in last 24 hours: Filed Vitals:   01/11/13 2100 01/12/13 0044 01/12/13 0419 01/12/13 0747  BP: 133/83 126/80 112/66 131/91  Pulse: 101 104 92 108  Temp: 97.9 F (36.6 C) 97.8 F (36.6 C) 97.8 F (36.6 C) 97.6 F (36.4 C)  TempSrc: Oral Oral Oral Oral  Resp: 25 21 14 19   Height:      Weight:   92.3 kg (203 lb 7.8 oz)   SpO2: 96% 92% 93% 99%   Weight change: 1.581 kg (3 lb 7.8 oz)  Intake/Output Summary (Last 24 hours) at 01/12/13 1139 Last data filed at 01/12/13 0749  Gross per 24 hour  Intake    360 ml  Output    900 ml  Net   -540 ml   BP 131/91   Pulse 108   Temp(Src) 97.6 F (36.4 C) (Oral)   Resp 19   Ht 5\' 11"  (1.803 m)   Wt 92.3 kg (203 lb 7.8 oz)   BMI 28.39 kg/m2   SpO2 99%  General Appearance:    Alert, cooperative, no distress, appears stated age  Head:    Normocephalic, without obvious abnormality, atraumatic  Throat:   Lips, mucosa, and tongue normal  Back:     Symmetric, no curvature  Lungs:     Clear to auscultation bilaterally, respirations unlabored  Heart:    Regular rate and rhythm, S1 and S2 normal, no murmur, rub   or gallop  Abdomen:     Soft, non-tender, bowel sounds active all four quadrants,    no masses, no organomegaly  Extremities:   Extremities normal, atraumatic, no cyanosis or edema  Pulses:   2+ and symmetric all extremities  Skin:   Skin color, texture, turgor normal   Lab Results:  Micro Results: Recent Results (from the past 240 hour(s))  MRSA PCR SCREENING     Status: None   Collection Time    01/10/13  2:23 PM      Result Value Range Status   MRSA by PCR NEGATIVE  NEGATIVE Final   Comment:            The GeneXpert MRSA Assay (FDA     approved for NASAL specimens     only), is one component of a     comprehensive MRSA colonization     surveillance program. It is not   intended to diagnose MRSA     infection nor to guide or     monitor treatment for     MRSA infections.   Studies/Results: No results found. Medications:  I have reviewed the patient's current medications. Prior to Admission:  Prescriptions prior to admission  Medication Sig Dispense Refill   bisoprolol-hydrochlorothiazide (ZIAC) 2.5-6.25 MG per tablet Take 1 tablet by mouth daily.       carisoprodol (SOMA) 350 MG tablet Take 350 mg by mouth 2 (two) times daily.       diazepam (VALIUM) 5 MG tablet Take 1-2 tablets (5-10 mg total) by mouth every 6 (six) hours as needed.  60 tablet  0   oxyCODONE-acetaminophen (PERCOCET/ROXICET) 5-325 MG per tablet Take 1-2 tablets by mouth every 4 (four) hours as needed.  80 tablet  0   Scheduled:  naproxen  500 mg Oral BID WC   rivaroxaban   Does not apply Once   Rivaroxaban  15 mg Oral BID  WC   [START ON 02/03/2013] rivaroxaban  20 mg Oral Q supper   sodium chloride  3 mL Intravenous Q12H   Continuous:   YQM:VHQION chloride, diazepam, oxyCODONE, oxyCODONE-acetaminophen, sodium chloride Scheduled Meds:  naproxen  500 mg Oral BID WC   rivaroxaban   Does not apply Once   Rivaroxaban  15 mg Oral BID WC   [START ON 02/03/2013] rivaroxaban  20 mg Oral Q supper   sodium chloride  3 mL Intravenous Q12H   Continuous Infusions:   PRN Meds:.sodium chloride, diazepam, oxyCODONE, oxyCODONE-acetaminophen, sodium chloride  Assessment/Plan by Problem: 1. Bilateral pulmonary embolism: Mr. Lomeli presents with an early, stable, PE, of severity index 98 (Class III), as per his chest CT.  Since hospitalization, he has required 4L O2 (satting 88% on RA upon arrival, 95% this morning). LLE with mild swelling and petichiae, but ultrasound would not change mgmt at this time. Etiology is unclear; it is unclear whether this is a provoked or unprovoked PE.  He has a history of recent immobilization of two days' duration two days prior to hospitalization, but  he did get up to walk dog/use restroom. History of back surgery 2 months ago, but was not immobile for a significant period of time; other risk factors include HTN & smoking. He denies any family or personal hx of PE/DVT and does not have any findings consistent with malignancy; his CBC is not suggestive of myeloproliferative disorders. - Transfer to floor bed with telemetry - Patient elected to pursue long-tem anticoagulation with Milinda Hirschfeld (Rivaroxaban), dosed per pharmacy consult - Pain managed with home regimen + oxycodone 5 mg PO q3h for breakthrough pain - PT/OT consulted - Ambulation encouraged, pending discontinuation of IV medications - Troponin x 3 (negative, no EKG changes)  - 2D echo demonstrated no RV strain  - Consider outpatient colonoscopy given family history and DVT  2. Hypertension: Stable.  - No signs or symptoms of hyper- or hypotension since arrival to ED. Given submassive PE, will hold bisoprolol-HCTZ   3. Spondylolisthesis of lumbar region: Stable, s/p L5-S1 decompression and fusion in October - Dr. Bebe Shaggy spoke with Dr. Lovell Sheehan who was comfortable with anticoagulation  - Continue home percocet 5-325 oral, q4h PRN - Continue home valium to avoid withdrawal (with hold parameters) 5 mg oral, q6h PRN - Withhold soma to avoid hypotension   4. Depression: Stable, admits to anxiety.  -Continue wellbutrin and valium   This is a Psychologist, occupational Note.  The care of the patient was discussed with Dr. Glendell Docker and the assessment and plan formulated with their assistance.  Please see their attached note for official documentation of the daily encounter.   LOS: 2 days   Jule Ser, MS 3 P: [phone] 01/12/2013, 11:39 AM

## 2013-01-12 NOTE — Progress Notes (Signed)
    Date: 01/12/2013      Patient name: Anthony Berg  Medical record number: [id number]  Date of birth: 07-24-1965  Assessment: 47 year old white man with acute PE, improving clinically.  Exam today: Appears well rested, chatty not wearing oxygen. Pain under moderate control. Lung exam significant for fewer crackles than before, heart rhythm regular and not tachycardic.   Plan  We will proceed to stepdown care towards the end of the day if he continues to improve.    Xarelto to start with pharmacy consult.   Case reviewed with IM resident team; rest of the plan per resident note,   Annalese Stiner, [facility] 01/12/2013, 11:14 AM.

## 2013-01-12 NOTE — Progress Notes (Signed)
ANTICOAGULATION CONSULT NOTE  Pharmacy Consult for heparin Indication: pulmonary embolus  No Known Allergies  Patient Measurements: Height: 5\' 11"  (180.3 cm) Weight: 203 lb 7.8 oz (92.3 kg) IBW/kg (Calculated) : 75.3 Heparin Dosing Weight: 90  Vital Signs: Temp: 97.8 F (36.6 C) (12/03 0044) Temp src: Oral (12/03 0044) BP: 126/80 mmHg (12/03 0044) Pulse Rate: 104 (12/03 0044)  Labs:  Recent Labs  01/10/13 0324 01/10/13 0558 01/10/13 0758 01/10/13 1150  01/10/13 1825  01/11/13 0805 01/11/13 1552 01/12/13 0034  HGB 14.0  --   --   --   --   --   --  12.4*  --   --   HCT 41.8  --   --   --   --   --   --  37.9*  --   --   PLT 193  --   --   --   --   --   --  192  --   --   APTT  --   --  36  --   --   --   --   --   --   --   LABPROT  --   --  13.5  --   --   --   --   --   --   --   INR  --   --  1.05  --   --   --   --   --   --   --   HEPARINUNFRC  --   --   --  0.49  < >  --   < > 0.26* 0.34 0.15*  CREATININE 0.74  --   --   --   --   --   --   --   --   --   TROPONINI  --  <0.30  --  <0.30  --  <0.30  --   --   --   --   < > = values in this interval not displayed.  Estimated Creatinine Clearance: 131.1 ml/min (by C-G formula based on Cr of 0.74).  Assessment: 47 yo male with PE for heparin  Goal of Therapy:  Heparin level 0.3-0.7 units/ml Monitor platelets by anticoagulation protocol: Yes   Plan:  Heparin 3000 units IV bolus, then increase heparin 2200 units/hr Check heparin level in 6 hours.  Geannie Risen, PharmD, BCPS   01/12/2013 2:50 AM

## 2013-01-12 NOTE — Care Management Note (Signed)
    Page 1 of 1   01/12/2013     1:57:47 PM   CARE MANAGEMENT NOTE 01/12/2013  Patient:  Anthony Berg, Anthony Berg   Account Number:  [id number]  Date Initiated:  01/12/2013  Documentation initiated by:  Lynford Espinoza  Subjective/Objective Assessment:   adm with dx of (B) PE     DC Planning Services  CM consult      Per UR Regulation:  Reviewed for med. necessity/level of care/duration of stay  Comments:  Per CMA:  Copay for 30 day supply of Xarelto is $117.05, however, if patient gets a 90 day supply at a [facility] only, it would cost $15.00/copay.  No prior Berkley Harvey is required.  Pt has card for free 30-day supply.

## 2013-01-12 NOTE — Progress Notes (Signed)
Subjective: Anthony Berg is a 47 y.o. man presenting with a PE.  He did well overnight with improved control of his chest pain, only requring one round of IV dilaudid. His tachycardia has resolved. O2 sats stable on 4 L.   Objective: Vital signs in last 24 hours: Filed Vitals:   01/11/13 1718 01/11/13 2100 01/12/13 0044 01/12/13 0419  BP: 123/75 133/83 126/80 112/66  Pulse: 99 101 104 92  Temp:  97.9 F (36.6 C) 97.8 F (36.6 C) 97.8 F (36.6 C)  TempSrc:  Oral Oral Oral  Resp: 12 25 21 14   Height:      Weight:    203 lb 7.8 oz (92.3 kg)  SpO2: 95% 96% 92% 93%   Weight change: 3 lb 7.8 oz (1.581 kg)  Intake/Output Summary (Last 24 hours) at 01/12/13 0641 Last data filed at 01/12/13 0500  Gross per 24 hour  Intake    338 ml  Output    550 ml  Net   -212 ml   General: resting in bed, no acute distress, appears as stated age  HEENT: PERRL, EOMI, no scleral icterus  Cardiac: RRR, no rubs, murmurs or gallops, tachycardic,  Pulm: soft bibasilar rales without wheezing, Abd: soft, nontender, nondistended, BS normoactive, abdominal binder in place  Ext/Back: warm and well perfused, left calf larger than right (~2cm) with petechial rash; well healed scar from lumbar back surgery  Neuro: alert and oriented X3, cranial nerves II-XII grossly intact, strength 5/5 b/l UE & LE   Lab Results: Basic Metabolic Panel:  Recent Labs Lab 01/10/13 0324  NA 134*  K 3.7  CL 96  CO2 26  GLUCOSE 157*  BUN 9  CREATININE 0.74  CALCIUM 9.0   Liver Function Tests:  Recent Labs Lab 01/10/13 1150  AST 16  ALT 16  ALKPHOS 76  BILITOT 0.8  PROT 7.6  ALBUMIN 3.3*   CBC:  Recent Labs Lab 01/10/13 0324 01/11/13 0805  WBC 13.8* 10.5  NEUTROABS 10.1*  --   HGB 14.0 12.4*  HCT 41.8 37.9*  MCV 95.7 95.7  PLT 193 192   Cardiac Enzymes:  Recent Labs Lab 01/10/13 0558 01/10/13 1150 01/10/13 1825  TROPONINI <0.30 <0.30 <0.30   Coagulation:  Recent Labs Lab  01/10/13 0758  LABPROT 13.5  INR 1.05   Studies/Results: Ct Angio Chest Pe W/cm &/or Wo Cm  01/10/2013   ADDENDUM REPORT: 01/10/2013 10:42  ADDENDUM: RV/LV ratio is 4.6/3.9 = 1.2   Electronically Signed   By: Elberta Fortis M.D.   On: 01/10/2013 10:42   01/10/2013   CLINICAL DATA:  Hypoxia and chest pain. Rule out pulmonary embolism.  EXAM: CT ANGIOGRAPHY CHEST WITH CONTRAST  TECHNIQUE: Multidetector CT imaging of the chest was performed using the standard protocol during bolus administration of intravenous contrast. Multiplanar CT image reconstructions including MIPs were obtained to evaluate the vascular anatomy.  CONTRAST:  OMNIPAQUE IOHEXOL 350 MG/ML SOLN  COMPARISON:  Chest x-ray 01/10/2013  FINDINGS: Examination demonstrates moderate thrombus over the distal right main pulmonary artery and proximal right upper, middle and lower lobar pulmonary arteries. There is evidence of small left lower lobar pulmonary emboli. Lungs are adequately inflated and demonstrate opacification over the lower lobes right worse than left and lingula. There is a wedge-shaped peripheral opacity over the posterior lateral right lower lobe likely infarction. There is mild cardiomegaly. RV/ LV ratio is 4.6/3.9 which is abnormal in compatible with right heart strain and intermediate risk/ submassive  pulmonary embolus.  Remaining mediastinal structures are unremarkable.  Images through the upper abdomen demonstrate a 2.2 cm simple cystic structure above the left kidney and adjacent the spleen likely an exophytic left renal cyst, however not completely evaluated on this exam. Bones and soft tissues are unremarkable.  Review of the MIP images confirms the above findings.  IMPRESSION: Moderate a large volume pulmonary emboli right worse than left as described. Evidence of right heart strain compatible intermediate risk/submassive pulmonary embolism. Evidence of associated bibasilar airspace opacification likely  infarction/atelectasis.  2.2 cm simple cystic structure in the left upper quadrant likely a renal cyst but incompletely evaluated on this exam.  Critical Value/emergent results were called by telephone at the time of interpretation on 01/10/2013 at 7:40 AM to Dr.Wickline, who verbally acknowledged these results.  Electronically Signed: By: Elberta Fortis M.D. On: 01/10/2013 07:42   Medications: I have reviewed the patient's current medications. Scheduled Meds: . naproxen  500 mg Oral BID WC  . sodium chloride  3 mL Intravenous Q12H   Continuous Infusions: . heparin 2,200 Units/hr (01/12/13 0319)   PRN Meds:.sodium chloride, diazepam, HYDROmorphone (DILAUDID) injection, oxyCODONE-acetaminophen, sodium chloride Assessment/Plan: Principal Problem:   Bilateral pulmonary embolism Active Problems:   Spondylolisthesis of lumbar region   Hypertension   Hypoxia  Anthony Berg is a 47 yo M with h/o HTN and lumbago admitted on 01/10/13 with c/o cough, SOB and chest pain.   # Bilateral pulmonary embolism: Stable, early presentation. PE severity index 98 (Class III)  Evidenced by CT chest, requiring 4L O2 (88% on RA upon arrival). LLE with mild swelling and petichiae, but ultrasound would not change mgmt at this time. Unclear etiology - history of recent immobilization x 2d, but did get up to walk dog/use restroom. History of back surgery 2 months ago, but was not immobile for a significant period of time. Other risk factors include HTN & smoking. No family or personal hx of PE/DVT. CBC not suggestive of myeloproliferative disorders. No history of renal disease to suggest nephrotic syndrome. No history of liver disease.  -Admit to inpatient, observe in SDU x 24h, then consider tele  -Heparin gtt per pharmacy, Patient elected to take Xarelto, placed consult to pharm to start xarelto and d/c heparin as indicated -Troponin x 3 (negative, no EKG changes)  -2D echo demonstrated no RV strain -Pain mgmt with home  regimen d/c dilaudid 1 mgt q 2 hrs prn (required 1 dose yesterday at 9 pm), start oxycodone IR 5 mg prn q 4 -Early for cancer screening, but given h/o father with colon cancer, consider outpatient colonoscopy  -Testing for hypercoagulability would not change mgmt at this time, will hold off given no validated guidelines (not a recurrent VTE, not in an unusual site, no strong family history)   # Hypertension: Stable.  No s/s of hypotension since arrival to ED. Given submassive PE, will hold bisoprolol-HCTZ   # Spondylolisthesis of lumbar region: Stable, s/p decompression & fusion 2 months prior  -Dr. Bebe Shaggy spoke with Dr. Lovell Sheehan, ok for anticoagulation  -Cont percocet & valium to avoid withdrawal (with hold parameters), will hold soma to avoid hypotension   # Depression: Stable, admits to anxiety.  -Continue valium   Dispo: Disposition is deferred at this time, awaiting improvement of current medical problems.  Anticipated discharge in approximately 2 day(s).   The patient does have a current PCP Aida Puffer, MD) and does not need an [facility] hospital follow-up appointment after discharge.  The patient does not  have transportation limitations that hinder transportation to clinic appointments.  .Services Needed at time of discharge: Y = Yes, Blank = No PT:   OT:   RN:   Equipment:   Other:     LOS: 2 days   Pleas Koch, MD 01/12/2013, 6:41 AM

## 2013-01-12 NOTE — Evaluation (Signed)
Occupational Therapy Evaluation Patient Details Name: Anthony Berg MRN: [id number] DOB: [birth year] Today's Date: 01/12/2013 Time: 4098-1191 OT Time Calculation (min): 20 min  OT Assessment / Plan / Recommendation History of present illness Mr. Anthony Berg is a 47 yo man with history of HTN & lumbago who presents on 01/10/13 to [facility] with complaints of worsening bloody cough with shortness of breath at rest since Saturday (2 days prior to admission).  In addition to cough/SOB, he reports 8/10 (at worst), non-radiating, right thoracic mid axillary crampy pain, that is sharp with inspiration and worse with movement.  X-ray positive for bilateral PEs.  Pt with recednt lumbar fusion in October via Dr. Lovell Sheehan.   Clinical Impression   Pt is currently independent/modified independent with functional transfers and selfcare tasks.  He does exhibit slight dyspnea at 2/4 with simulated selfcare tasks but O2 sats remained at 90% or better on room air during session.   Pt still using back brace from previous surgery for support and is well aware of his back precautions also.  No further acute OT needs.   OT Assessment  Patient does not need any further OT services    Follow Up Recommendations  No OT follow up       Equipment Recommendations  None recommended by OT          Precautions / Restrictions Precautions Precautions: Back Required Braces or Orthoses: Spinal Brace Spinal Brace: Lumbar corset;Applied in sitting position Restrictions Weight Bearing Restrictions: No   Pertinent Vitals/Pain O2 sats 92% at rest HR 100 BPM, increasing to 125 with mobility and O2 sats at 90% on room air    ADL  Eating/Feeding: Simulated;Independent Where Assessed - Eating/Feeding: Chair Grooming: Performed;Teeth care;Modified independent Where Assessed - Grooming: Unsupported standing Upper Body Bathing: Simulated;Independent Where Assessed - Upper Body Bathing: Unsupported standing Lower Body Bathing:  Simulated;Modified independent Where Assessed - Lower Body Bathing: Unsupported sit to stand Upper Body Dressing: Performed;Independent (to donn TLSO in sitting) Where Assessed - Upper Body Dressing: Unsupported sitting Lower Body Dressing: Simulated;Modified independent Where Assessed - Lower Body Dressing: Unsupported sit to stand Toilet Transfer: Simulated;Modified independent Toilet Transfer Method: Other (comment) (ambulate without assistive device, grab bars in the bathroom) Toilet Transfer Equipment: Regular height toilet;Grab bars Tub/Shower Transfer: Simulated;Modified independent Tub/Shower Transfer Method: Ambulating Equipment Used: Back brace Transfers/Ambulation Related to ADLs: Pt ambulates with independence using his lumbar corsett for back support. ADL Comments: Pt recalls back precautions from previous surgery.  Reports having a reacher that he uses at home as well.  Pt had gotten back to being somewhat active and was actually using his riding lawnmower at times.  O2 sats 90%-92% with acitivity and mobilization this session.      Visit Information  Last OT Received On: 01/12/13 Assistance Needed: +1 History of Present Illness: Mr. Anthony Berg is a 47 yo man with history of HTN & lumbago who presents on 01/10/13 to [facility] with complaints of worsening bloody cough with shortness of breath at rest since Saturday (2 days prior to admission).  In addition to cough/SOB, he reports 8/10 (at worst), non-radiating, right thoracic mid axillary crampy pain, that is sharp with inspiration and worse with movement.  X-ray positive for bilateral PEs.  Pt with recednt lumbar fusion in October via Dr. Lovell Sheehan.       Prior Functioning     Home Living Family/patient expects to be discharged to:: Private residence Living Arrangements: Spouse/significant other Available Help at Discharge: Available PRN/intermittently  Type of Home: House Home Access: Stairs to enter [employer] of Steps: 15 Entrance Stairs-Rails: Can reach both Home Layout: Two level (Pt has been living upstairs ) Home Equipment: Environmental consultant - 2 wheels;Cane - single point;Crutches;Bedside commode;Hand held shower head;Shower seat - built in Prior Function Level of Independence: Independent Communication Communication: No difficulties Dominant Hand: Right         Vision/Perception Vision - History Baseline Vision: No visual deficits Patient Visual Report: No change from baseline Vision - Assessment Vision Assessment: Vision not tested Perception Perception: Within Functional Limits Praxis Praxis: Intact   Cognition  Cognition Arousal/Alertness: Awake/alert Behavior During Therapy: WFL for tasks assessed/performed Overall Cognitive Status: Within Functional Limits for tasks assessed    Extremity/Trunk Assessment Upper Extremity Assessment Upper Extremity Assessment: Overall WFL for tasks assessed Lower Extremity Assessment Lower Extremity Assessment: Defer to PT evaluation Cervical / Trunk Assessment Cervical / Trunk Assessment: Normal     Mobility Bed Mobility Bed Mobility: Supine to Sit Supine to Sit: 6: Modified independent (Device/Increase time) Details for Bed Mobility Assistance: Pt did not log roll but sat up from supine using momentum to achieve sitting position. Transfers Transfers: Sit to Stand;Stand to Sit Sit to Stand: 6: Modified independent (Device/Increase time);From bed;With upper extremity assist Stand to Sit: 6: Modified independent (Device/Increase time);To bed;With upper extremity assist        Balance Balance Balance Assessed: Yes Dynamic Standing Balance Dynamic Standing - Balance Support: No upper extremity supported Dynamic Standing - Level of Assistance: 7: Independent (during simulated selfcare tasks)   End of Session OT - End of Session Equipment Utilized During Treatment: Back brace Activity Tolerance: Patient tolerated treatment  well;Other (comment) (dyspnea noted but pt did not need a break after mobilizing greater than 500 ft      ) Patient left: in bed;with nursing/sitter in room;with call bell/phone within reach Nurse Communication: Mobility status     Sherice Ijames OTR/L 01/12/2013, 2:55 PM

## 2013-01-12 NOTE — Discharge Summary (Signed)
Physician Discharge Summary  Patient ID: Anthony Berg MRN: [id number] DOB/AGE: Nov 27, 1965 47 y.o.  Admit date: 01/10/2013 Discharge date: 01/12/2013  Admission Diagnoses: Bilateral Pulmonary Embolism   Discharge Diagnoses: Bilateral Pulmonary Embolism  Hospital Course: Anthony Berg is a 47 year-old male who was transported to the ED on 12/1 with complains of worsening bloody cough and shortness of breath at rest since of two 2 days' duration and was admitted for a bilateral pulmonary embolism.  Troponins were immediately checked and were negative times three, CXR performed at time of admission demonstrated mild base opacities, linear atelectasis and no evidence for pleural effusion or pneumothorax and an EKG performed had no significant findings except for tachycardia at 112 beats per minute.  CT angiogram performed shortly thereafter demonstrated a moderate thrombus over the distal right main pulmonary artery and proximal right upper, middle and lower lobar pulmonary arteries and evidence of small left lower lobar pulmonary emboli; CT revealed evidence of right heart strain.  Echocardiogram revealed increased pulmonary systolic pressure, right atrium size was upper limit of normal and a normal right ventricle.  He was admitted to the step-down unit for close monitoring with telemetry and was immediately started on a heparin drip, which was monitored and adjusted by pharmacy, and 4 liters of oxygen by nasal cannula and his home pain regimen.  On his first day of hospitalization, Anthony Berg had pain and agitation and was tachycardiac to the 130's; he was given valium and IV morphine and stat EKG was performed, which demonstrated no changes.  Morphine was transitioned to IV dilaudid on the second day of hospitalization.  On the third day of hospitalization, he was weaned off oxygen, elected for long-term anticoagulation with rivaroxaban (Xarelto) and was oxycodone was substituted for dilaudid for break-through  pain.  That night, he desaturated to the upper 80's on room air while sleeping, which recovered to 99% on 2 L oxygen by nasal cannula.  This was concerning for possible sleep apnea, which was deferred to the outpatient setting.  On his day of discharge (fourth hospital day), he was saturating well on room air while awake, was able to ambulate and was taking all oral medications.  Discharge Exam: Blood pressure 126/74, pulse 105, temperature 98 F (36.7 C), temperature source Oral, resp. rate 18, height 5\' 11"  (1.803 m), weight 92.3 kg (203 lb 7.8 oz), SpO2 96.00%. General appearance: alert, cooperative and no distress Head: Normocephalic, without obvious abnormality, atraumatic Nose: no discharge, no sinus tenderness Throat: lips, mucosa, and tongue normal; teeth and gums normal Resp: clear to auscultation bilaterally Cardio: regular rate and rhythm, S1, S2 normal, no murmur, click, rub or gallop Extremities: Warm and well perfused, left calf slightly larger than right; unchanged from admission Pulses: 2+ and symmetric Neuro: A&Ox3, strength and sensation bilaterally intact and appropriate, CN II-XII grossly intact  Disposition: 01-Home or Self Care     Medication List    ASK your doctor about these medications       bisoprolol-hydrochlorothiazide 2.5-6.25 MG per tablet  Commonly known as:  ZIAC  Take 1 tablet by mouth daily.     diazepam 5 MG tablet  Commonly known as:  VALIUM  Take 1-2 tablets (5-10 mg total) by mouth every 6 (six) hours as needed.     oxyCODONE-acetaminophen 5-325 MG per tablet  Commonly known as:  PERCOCET/ROXICET  Take 1-2 tablets by mouth every 4 (four) hours as needed.     SOMA 350 MG tablet  Generic drug:  carisoprodol  Take 350 mg by mouth 2 (two) times daily.       Signed: [name] 01/12/2013, 1:31 PM

## 2013-01-12 NOTE — Progress Notes (Signed)
I have seen the patient and reviewed the daily progress note by Jule Ser MS III and discussed the care of the patient with them.  See my note for documentation of my findings, assessment, and plans.

## 2013-01-13 MED ORDER — RIVAROXABAN 20 MG PO TABS: 20.0000 mg | ORAL_TABLET | Freq: Every day | ORAL | Status: DC

## 2013-01-13 MED ORDER — RIVAROXABAN 15 MG PO TABS: 15.0000 mg | ORAL_TABLET | Freq: Two times a day (BID) | ORAL | Status: DC

## 2013-01-13 NOTE — Progress Notes (Signed)
PT Cancellation Note  Patient Details Name: Anthony Berg MRN: [id number] DOB: May 03, 1965   Cancelled Treatment:    Reason Eval/Treat Not Completed: PT screened, no needs identified, will sign off. Pt ambulatory in halls without difficulty.   Yesika Rispoli 01/13/2013, 2:21 PM

## 2013-01-13 NOTE — Progress Notes (Signed)
Subjective: Anthony Berg is a 47 y.o. man presenting with a PE.  No acute events overnight. Doing well. Pt requests to go home.   Objective: Vital signs in last 24 hours: Filed Vitals:   01/12/13 1345 01/12/13 1444 01/12/13 1948 01/13/13 0419  BP: 142/93  136/90 134/90  Pulse: 101 125 108 91  Temp: 98.6 F (37 C)  97.9 F (36.6 C) 98.5 F (36.9 C)  TempSrc: Oral  Oral Oral  Resp: 20  18   Height:      Weight:      SpO2: 94% 90% 93% 94%   Weight change:   Intake/Output Summary (Last 24 hours) at 01/13/13 0718 Last data filed at 01/13/13 0545  Gross per 24 hour  Intake    480 ml  Output   2600 ml  Net  -2120 ml   General: resting in bed, no acute distress, appears as stated age  HEENT: PERRL, EOMI, no scleral icterus  Cardiac: RRR, no rubs, murmurs or gallops, tachycardic,  Pulm: soft bibasilar rales without wheezing, Abd: soft, nontender, nondistended, BS normoactive, abdominal binder in place  Ext/Back: warm and well perfused, left calf larger than right (~2cm) with petechial rash; well healed scar from lumbar back surgery  Neuro: alert and oriented X3, cranial nerves II-XII grossly intact, strength 5/5 b/l UE & LE   Lab Results: Basic Metabolic Panel:  Recent Labs Lab 01/10/13 0324 01/12/13 0940  NA 134* 139  K 3.7 3.7  CL 96 100  CO2 26 25  GLUCOSE 157* 165*  BUN 9 13  CREATININE 0.74 0.60  CALCIUM 9.0 8.9   Liver Function Tests:  Recent Labs Lab 01/10/13 1150 01/12/13 0940  AST 16 15  ALT 16 15  ALKPHOS 76 72  BILITOT 0.8 0.3  PROT 7.6 7.1  ALBUMIN 3.3* 2.9*   CBC:  Recent Labs Lab 01/10/13 0324 01/11/13 0805  WBC 13.8* 10.5  NEUTROABS 10.1*  --   HGB 14.0 12.4*  HCT 41.8 37.9*  MCV 95.7 95.7  PLT 193 192   Cardiac Enzymes:  Recent Labs Lab 01/10/13 0558 01/10/13 1150 01/10/13 1825  TROPONINI <0.30 <0.30 <0.30   Coagulation:  Recent Labs Lab 01/10/13 0758  LABPROT 13.5  INR 1.05   Studies/Results: No results  found. Medications: I have reviewed the patient's current medications. Scheduled Meds: . naproxen  500 mg Oral BID WC  . Rivaroxaban  15 mg Oral BID WC  . [START ON 02/03/2013] rivaroxaban  20 mg Oral Q supper  . sodium chloride  3 mL Intravenous Q12H   Continuous Infusions:   PRN Meds:.sodium chloride, diazepam, oxyCODONE, oxyCODONE-acetaminophen, sodium chloride Assessment/Plan: Principal Problem:   Bilateral pulmonary embolism Active Problems:   Spondylolisthesis of lumbar region   Hypertension   Hypoxia  Anthony Berg is a 47 yo M with h/o HTN and lumbago admitted on 01/10/13 with c/o cough, SOB and chest pain.   # Bilateral pulmonary embolism: Stable, . PE severity index 98 (Class III) at presentation. Evidenced by CT chest, requiring 4L O2 (88% on RA upon arrival). LLE with mild swelling and petichiae, but ultrasound would not change mgmt at this time. Unclear etiology - history of recent immobilization x 2d, but did get up to walk dog/use restroom. History of back surgery 2 months ago, but was not immobile for a significant period of time. Other risk factors include HTN & smoking. No family or personal hx of PE/DVT. CBC not suggestive of myeloproliferative disorders. No history  of renal disease to suggest nephrotic syndrome. No history of liver disease.  -Admit to inpatient, observe in SDU x 24h, then consider tele  -Heparin gtt per pharmacy, Patient on Xarelto -Troponin x 3 (negative, no EKG changes)  -2D echo demonstrated no RV strain -Pain mgmt with home regimen + oxycodone 5 mg q4 prn. Pt did not require any oxycodone IR. Will not be prescribing on d/c -Early for cancer screening, but given h/o father with colon cancer, consider outpatient colonoscopy  -Testing for hypercoagulability would not change mgmt at this time, will hold off given no validated guidelines (not a recurrent VTE, not in an unusual site, no strong family history)   # Hypertension: Stable.  No s/s of  hypotension since arrival to ED. Given submassive PE, will hold bisoprolol-HCTZ   # Spondylolisthesis of lumbar region: Stable, s/p decompression & fusion 2 months prior  -Dr. Bebe Shaggy spoke with Dr. Lovell Sheehan, ok for anticoagulation  -Cont percocet & valium to avoid withdrawal (with hold parameters), will hold soma to avoid hypotension   # Depression: Stable, admits to anxiety.  -Continue valium   Dispo: Disposition is deferred at this time, awaiting improvement of current medical problems.  Anticipated discharge in approximately 2 day(s).   The patient does have a current PCP Aida Puffer, MD) and does not need an [facility] hospital follow-up appointment after discharge.  The patient does not have transportation limitations that hinder transportation to clinic appointments.  .Services Needed at time of discharge: Y = Yes, Blank = No PT:   OT:   RN:   Equipment:   Other:     LOS: 3 days   Anthony Koch, MD 01/13/2013, 7:18 AM

## 2013-01-13 NOTE — Progress Notes (Signed)
I agree with the Medical students note. Please see my note.  Angelina Sheriff, MD

## 2013-01-13 NOTE — Progress Notes (Signed)
Went over discharge instructions with the patient. Patient has no additional questions or concerns related to discharge. Patient taken off the cardiac monitor. IV'S D/C'd. Patient stable and ready for discharge. Anthony Berg

## 2013-01-13 NOTE — Discharge Summary (Signed)
   Date: 01/13/2013    Patient name: Anthony Berg  MRN: [id number]  Date of birth: [birth year]  I discussed the discharge plan with my resident team on the day of discharge. I agree with the discharge documentation and disposition.     Aletta Edouard 01/13/2013, 4:25 PM

## 2013-01-13 NOTE — Discharge Summary (Signed)
Name: Anthony Berg MRN: [id number] DOB: Jul 31, 1965 47 y.o. PCP: Aida Puffer, MD  Date of Admission: 01/10/2013  2:57 AM Date of Discharge: 01/13/2013 Attending Physician: Aletta Edouard, MD  Discharge Diagnosis:  Principal Problem:   Bilateral pulmonary embolism Active Problems:   Spondylolisthesis of lumbar region   Hypertension   Hypoxia  Discharge Medications:   Medication List    ASK your doctor about these medications       bisoprolol-hydrochlorothiazide 2.5-6.25 MG per tablet  Commonly known as:  ZIAC  Take 1 tablet by mouth daily.     diazepam 5 MG tablet  Commonly known as:  VALIUM  Take 1-2 tablets (5-10 mg total) by mouth every 6 (six) hours as needed.     oxyCODONE-acetaminophen 5-325 MG per tablet  Commonly known as:  PERCOCET/ROXICET  Take 1-2 tablets by mouth every 4 (four) hours as needed.     SOMA 350 MG tablet  Generic drug:  carisoprodol  Take 350 mg by mouth 2 (two) times daily.        Disposition and follow-up:   Anthony Berg was discharged from [facility] in Stable condition.  At the hospital follow up visit please address:  1.  Compliance with Xarelto, Low Back Pain, Blood Pressure Medicines (was normotensive in hospital, therefore, BP meds were Berg on d/c), Possible Sleep Apnea  2.  Labs / imaging needed at time of follow-up: None  3.  Pending labs/ test needing follow-up: None  Follow-up Appointments:     Follow-up Information   Follow up with Aida Puffer, MD On 01/18/2013. (9:45 AM)    Specialty:  Family Medicine   Contact information:   [phone] [place] [address] [phone]       Discharge Instructions:   Procedures Performed:  Dg Chest 2 View  01/10/2013   *RADIOLOGY REPORT*  Clinical Data: Chest pain, cough  CHEST - 2 VIEW  Comparison: 11/05/1938  Findings: Mild lung base opacities. Linear areas of atelectasis. Cardiomediastinal contours within normal range for mild  hypoaeration.  No pleural effusion or pneumothorax.  Multilevel degenerative change without acute osseous finding.  IMPRESSION: Linear and lung base opacities, favor atelectasis.   Original Report Authenticated By: Jearld Lesch, M.D.   Ct Angio Chest Pe W/cm &/or Wo Cm  01/10/2013   ADDENDUM REPORT: 01/10/2013 10:42  ADDENDUM: RV/LV ratio is 4.6/3.9 = 1.2   Electronically Signed   By: Elberta Fortis M.D.   On: 01/10/2013 10:42   01/10/2013   CLINICAL DATA:  Hypoxia and chest pain. Rule out pulmonary embolism.  EXAM: CT ANGIOGRAPHY CHEST WITH CONTRAST  TECHNIQUE: Multidetector CT imaging of the chest was performed using the standard protocol during bolus administration of intravenous contrast. Multiplanar CT image reconstructions including MIPs were obtained to evaluate the vascular anatomy.  CONTRAST:  OMNIPAQUE IOHEXOL 350 MG/ML SOLN  COMPARISON:  Chest x-ray 01/10/2013  FINDINGS: Examination demonstrates moderate thrombus over the distal right main pulmonary artery and proximal right upper, middle and lower lobar pulmonary arteries. There is evidence of small left lower lobar pulmonary emboli. Lungs are adequately inflated and demonstrate opacification over the lower lobes right worse than left and lingula. There is a wedge-shaped peripheral opacity over the posterior lateral right lower lobe likely infarction. There is mild cardiomegaly. RV/ LV ratio is 4.6/3.9 which is abnormal in compatible with right heart strain and intermediate risk/ submassive pulmonary embolus.  Remaining mediastinal structures are unremarkable.  Images through the  upper abdomen demonstrate a 2.2 cm simple cystic structure above the left kidney and adjacent the spleen likely an exophytic left renal cyst, however not completely evaluated on this exam. Bones and soft tissues are unremarkable.  Review of the MIP images confirms the above findings.  IMPRESSION: Moderate a large volume pulmonary emboli right worse than left as  described. Evidence of right heart strain compatible intermediate risk/submassive pulmonary embolism. Evidence of associated bibasilar airspace opacification likely infarction/atelectasis.  2.2 cm simple cystic structure in the left upper quadrant likely a renal cyst but incompletely evaluated on this exam.  Critical Value/emergent results were called by telephone at the time of interpretation on 01/10/2013 at 7:40 AM to Dr.Wickline, who verbally acknowledged these results.  Electronically Signed: By: Elberta Fortis M.D. On: 01/10/2013 07:42    2D Echo:   - Left ventricle: The cavity size was normal. Wall thickness was increased in a pattern of mild LVH. Systolic function was normal. The estimated ejection fraction was in the range of 60% to 65%. Wall motion was normal; there were no regional wall motion abnormalities. Left ventricular diastolic function parameters were normal. - Left atrium: The atrium was mildly dilated. - Pulmonary arteries: Systolic pressure was moderately increased. PA peak pressure: 44mm Hg (S).  Right ventricle: The cavity size was normal. Wall thickness was normal. Systolic function was normal.   Admission HPI: Anthony Berg is a 47 yo man with history of HTN & lumbago who presents on 01/10/13 to [facility] with complaints of worsening bloody cough with shortness of breath at rest since Saturday (2 days prior to admission). In addition to cough/SOB, he reports 8/10 (at worst), non-radiating, right thoracic mid axillary crampy pain, that is sharp with inspiration and worse with movement. He reports that on Wednesday, he was working in the yard without symptoms and was subsequently sore. On Thursday he drove to [facility] and back, but then stayed in bed for the majority of the time Friday - Sunday. No other recent travel. He tried soma, xanax, and percocet with some relief (4-5/10). No similar symptoms in the past. He has noticed increased swelling of his right leg recently  (though he has had chronic intermittent swelling of his right knee since his knee surgery in 2000). He was prompted to come to the ED by his fiance Tresa Endo) who is a nurse secondary to his pain, cough and SOB.  Of note, he did have a lumbar decompression and fusion in October, but has been very active since then.    Hospital Course by problem list: Principal Problem:   Bilateral pulmonary embolism Active Problems:   Spondylolisthesis of lumbar region   Hypertension   Hypoxia   1. Bil. PE  Anthony Berg is a 47 year-old male who was transported to the ED on 12/1 with complains of worsening bloody cough and shortness of breath at rest since of two 2 days' duration and was admitted for a bilateral pulmonary embolism. Troponins were immediately checked and were negative times three, CXR performed at time of admission demonstrated mild base opacities, linear atelectasis and no evidence for pleural effusion or pneumothorax and an EKG performed had no significant findings except for tachycardia at 112 beats per minute. CT angiogram performed shortly thereafter demonstrated a moderate thrombus over the distal right main pulmonary artery and proximal right upper, middle and lower lobar pulmonary arteries and evidence of small left lower lobar pulmonary emboli; CT revealed evidence concerning for right heart strain. Echocardiogram revealed moderately increased  pulmonary systolic pressure, right atrium size was upper limit of normal and a normal right ventricle function. Thus, there was no right heart strain by echo. He was admitted to the step-down unit for close monitoring with telemetry and was immediately started on a heparin drip, which was monitored and adjusted by pharmacy, and 4 liters of oxygen by nasal cannula and his home pain regimen. On his first day of hospitalization, Anthony Berg had pain and agitation and was tachycardiac to the 130's; he was given valium and IV morphine and stat EKG was performed, which  demonstrated no changes. Morphine was transitioned to IV dilaudid on the second day of hospitalization. On the third day of hospitalization, he was weaned off oxygen, elected for long-term anticoagulation with rivaroxaban (Xarelto) and was oxycodone was substituted for dilaudid for break-through pain. That night, he desaturated to the upper 80's on room air while sleeping, which recovered to 99% on 2 L oxygen by nasal cannula. This was concerning for possible sleep apnea, which was deferred to the outpatient setting.   On his day of discharge (fourth hospital day), he was saturating well on room air while awake, was able to ambulate and was taking all oral medications.   Discharge Vitals:   BP 134/90  Pulse 91  Temp(Src) 98.5 F (36.9 C) (Oral)  Resp 18  Ht 5\' 11"  (1.803 m)  Wt 203 lb 7.8 oz (92.3 kg)  BMI 28.39 kg/m2  SpO2 94%  Discharge Labs:  No results found for this or any previous visit (from the past 24 hour(s)).  Signed: Pleas Koch, MD 01/13/2013, 10:30 AM   Time Spent on Discharge: 35 minutes Services Ordered on Discharge: None Equipment Ordered on Discharge: None

## 2013-01-13 NOTE — Progress Notes (Signed)
Subjective: No acute events or complaints overnight.  Per nursing, his oxygen saturation overnight dropped to the upper 80's, but was 99% on 2 L and has been in the 90's since on room air.   Objective: Vital signs in last 24 hours: Filed Vitals:   01/12/13 1345 01/12/13 1444 01/12/13 1948 01/13/13 0419  BP: 142/93  136/90 134/90  Pulse: 101 125 108 91  Temp: 98.6 F (37 C)  97.9 F (36.6 C) 98.5 F (36.9 C)  TempSrc: Oral  Oral Oral  Resp: 20  18   Height:      Weight:      SpO2: 94% 90% 93% 94%   Weight change:   Intake/Output Summary (Last 24 hours) at 01/13/13 4098 Last data filed at 01/13/13 0545  Gross per 24 hour  Intake    480 ml  Output   1700 ml  Net  -1220 ml   BP 134/90   Pulse 91   Temp(Src) 98.5 F (36.9 C) (Oral)   Resp 18   Ht 5\' 11"  (1.803 m)   Wt 92.3 kg (203 lb 7.8 oz)   BMI 28.39 kg/m2   SpO2 94%  General Appearance:    Alert, cooperative, no distress, appears stated age  Head:    Normocephalic, without obvious abnormality, atraumatic  Throat:   Lips, mucosa, and tongue normal  Back:     Symmetric, no curvature  Lungs:     Clear to auscultation bilaterally, respirations unlabored  Heart:    Regular rate and rhythm, S1 and S2 normal, no murmur, rub   or gallop  Abdomen:     Soft, non-tender, bowel sounds active all four quadrants,    no masses, no organomegaly  Extremities:   Extremities normal, atraumatic, no cyanosis or edema  Pulses:   2+ and symmetric all extremities  Skin:   Skin color, texture, turgor normal   Lab Results:  Micro Results: Recent Results (from the past 240 hour(s))  MRSA PCR SCREENING     Status: None   Collection Time    01/10/13  2:23 PM      Result Value Range Status   MRSA by PCR NEGATIVE  NEGATIVE Final   Comment:            The GeneXpert MRSA Assay (FDA     approved for NASAL specimens     only), is one component of a     comprehensive MRSA colonization     surveillance program. It is not     intended to  diagnose MRSA     infection nor to guide or     monitor treatment for     MRSA infections.   Studies/Results: No results found. Medications:  I have reviewed the patient's current medications. Prior to Admission:  Prescriptions prior to admission  Medication Sig Dispense Refill   bisoprolol-hydrochlorothiazide (ZIAC) 2.5-6.25 MG per tablet Take 1 tablet by mouth daily.       carisoprodol (SOMA) 350 MG tablet Take 350 mg by mouth 2 (two) times daily.       diazepam (VALIUM) 5 MG tablet Take 1-2 tablets (5-10 mg total) by mouth every 6 (six) hours as needed.  60 tablet  0   oxyCODONE-acetaminophen (PERCOCET/ROXICET) 5-325 MG per tablet Take 1-2 tablets by mouth every 4 (four) hours as needed.  80 tablet  0   Scheduled:  naproxen  500 mg Oral BID WC   Rivaroxaban  15 mg Oral BID WC   [  START ON 02/03/2013] rivaroxaban  20 mg Oral Q supper   sodium chloride  3 mL Intravenous Q12H   Continuous:   NWG:NFAOZH chloride, diazepam, oxyCODONE, oxyCODONE-acetaminophen, sodium chloride Scheduled Meds:  naproxen  500 mg Oral BID WC   Rivaroxaban  15 mg Oral BID WC   [START ON 02/03/2013] rivaroxaban  20 mg Oral Q supper   sodium chloride  3 mL Intravenous Q12H   Continuous Infusions:   PRN Meds:.sodium chloride, diazepam, oxyCODONE, oxyCODONE-acetaminophen, sodium chloride  Assessment/Plan by Problem: 1. Bilateral pulmonary embolism: Mr. Anthony Berg presents with an early, stable, PE, of severity index 98 (Class III), as per his chest CT.  Since hospitalization, he has required 4L O2 (satting 88% on RA upon arrival, 95% this morning). LLE with mild swelling and petichiae, but ultrasound would not change mgmt at this time. Etiology is unclear; it is unclear whether this is a provoked or unprovoked PE.  He has a history of recent immobilization of two days' duration two days prior to hospitalization, but he did get up to walk dog/use restroom. History of back surgery 2 months ago, but was  not immobile for a significant period of time; other risk factors include HTN & smoking. He denies any family or personal hx of PE/DVT and does not have any findings consistent with malignancy; his CBC is not suggestive of myeloproliferative disorders. - Anticoagulation with Xarelo (Rivaroxaban), dosed 15 mg BID for 21 days, then 30 mg BID thereafter, per pharmacy consult - Floor status with telemetry - Case management determined [facility] cost $5/month; patient has card for free first 30 days - Pain managed with home regimen + oxycodone 5 mg PO q3h for breakthrough pain - OT cleared, certified that he has no further OT needs - Ambulation encouraged, pending discontinuation of IV medications - Troponin x 3 (negative, no EKG changes)  - 2D echo demonstrated no RV strain  - Consider outpatient colonoscopy given family history and DVT - Consider outpatient sleep study due to suspicion of sleep apnea and overnight desaturation  2. Hypertension: Stable.  - No signs or symptoms of hyper- or hypotension since arrival to ED. Given submassive PE, will hold bisoprolol-HCTZ   3. Spondylolisthesis of lumbar region: Stable, s/p L5-S1 decompression and fusion in October - Dr. Bebe Shaggy spoke with Dr. Lovell Sheehan who was comfortable with anticoagulation  - Continue home percocet 5-325 oral, q4h PRN - Continue home valium to avoid withdrawal (with hold parameters) 5 mg oral, q6h PRN - Withhold soma to avoid hypotension   4. Depression: Stable, admits to anxiety.  - Monitor  Disposition: Discharge home with no continuing home health services   This is a Psychologist, occupational Note.  The care of the patient was discussed with Dr. Glendell Docker and the assessment and plan formulated with their assistance.  Please see their attached note for official documentation of the daily encounter.   LOS: 3 days   Anthony Berg, [place] 3 P: [phone] 01/13/2013, 9:06 AM

## 2013-01-14 NOTE — Discharge Summary (Signed)
I have read and agree with Romeo Apple Buck's note. Please see my note.  Angelina Sheriff, MD

## 2013-02-25 ENCOUNTER — Telehealth: Payer: Self-pay | Admitting: Hematology and Oncology

## 2013-02-25 NOTE — Telephone Encounter (Signed)
S/W PT AND GVE NP APPT 01/20  @ 10:30 W/DR. GORSUCH  REFERRING Deloris PingLESLIE SHARPE, NP  DX-PE WELCOME PACKET MAILED.

## 2013-02-25 NOTE — Telephone Encounter (Signed)
C/D 02/25/13 for appt. 03/01/13

## 2013-03-01 ENCOUNTER — Ambulatory Visit: Payer: BC Managed Care – PPO

## 2013-03-01 ENCOUNTER — Ambulatory Visit: Payer: BC Managed Care – PPO | Admitting: Hematology and Oncology

## 2013-03-08 ENCOUNTER — Ambulatory Visit: Payer: BC Managed Care – PPO

## 2013-03-08 ENCOUNTER — Encounter: Payer: Self-pay | Admitting: Hematology and Oncology

## 2013-03-08 ENCOUNTER — Ambulatory Visit (HOSPITAL_BASED_OUTPATIENT_CLINIC_OR_DEPARTMENT_OTHER): Payer: BC Managed Care – PPO | Admitting: Hematology and Oncology

## 2013-03-08 ENCOUNTER — Encounter (INDEPENDENT_AMBULATORY_CARE_PROVIDER_SITE_OTHER): Payer: Self-pay

## 2013-03-08 ENCOUNTER — Telehealth: Payer: Self-pay | Admitting: Hematology and Oncology

## 2013-03-08 VITALS — BP 137/97 | HR 94 | Temp 98.4°F | Resp 20 | Ht 71.0 in | Wt 213.2 lb

## 2013-03-08 DIAGNOSIS — I2699 Other pulmonary embolism without acute cor pulmonale: Secondary | ICD-10-CM

## 2013-03-08 DIAGNOSIS — Z7901 Long term (current) use of anticoagulants: Secondary | ICD-10-CM

## 2013-03-08 NOTE — Progress Notes (Signed)
[facility] CONSULT NOTE  Patient Care Team: Aida Puffer, MD as PCP - General (Family Medicine)  CHIEF COMPLAINTS/PURPOSE OF CONSULTATION:  Bilateral PE  HISTORY OF PRESENTING ILLNESS:  Anthony Berg 48 y.o. male is here because of recent diagnosis of bilateral PE According to the patient, he had significant history of spinal spondylolisthesis as well as degenerative joint disease and his back. The patient underwent surgery in October 2014. He was hospitalized for 5 days and between October to December, has been quite immobile due to his surgery.  He subsequently woke up on 01/10/2013 with severe chest pain. His girlfriend check his oxygen saturation and noted that the patient have hypoxemic respiratory failure, low-grade fever and tachycardia. He was directed to the emergency department and had extensive evaluation. CT scan of the chest show bilateral pulmonary emboli with significant clot burden. The patient was placed on anticoagulation therapy and subsequently transitioned to oral anticoagulation therapy. Over the past 2 months, he denies any bleeding complication from anticoagulation therapy.  He denies lower extremity swelling, warmth, tenderness & erythema.  He denies recent chest pain on exertion, shortness of breath on minimal exertion, pre-syncopal episodes, hemoptysis, or palpitation. He denies recent history of trauma, long distance travel, or dehydration. The patient has chronic tobacco abuse with smoking He had no prior history or diagnosis of cancer. His age appropriate screening programs are up-to-date. He had prior surgeries before and never had perioperative thromboembolic events. Specifically he had 4 episodes of knee surgeries The patient had never been exposed to testosterone replacement therapy. There is no family history of blood clots or miscarriages.  MEDICAL HISTORY:  Past Medical History  Diagnosis Date  . Hypertension   . Depression   . Low back  pain potentially associated with radiculopathy   . Anxiety     SURGICAL HISTORY: Past Surgical History  Procedure Laterality Date  . 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 Tonsillectomy    . Lumbar fusion  11/11/12    L5-S1 decompresion and fusion by Dr. Lovell Sheehan    SOCIAL HISTORY: History   Social History  . Marital Status: Single    Spouse Name: N/A    Number of Children: 1  . Years of Education: college   Occupational History  . Not on file.   Social History Main Topics  . Smoking status: Current Every Day Smoker -- 1.00 packs/day for 8 years    Types: Cigarettes  . Smokeless tobacco: Never Used     Comment: smoking cessation discussed  . Alcohol Use: 0.6 oz/week    1 Glasses of wine, 4-5 Cans of beer per week  . Drug Use: No  . Sexual Activity: Not on file   Other Topics Concern  . 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 Dropped out of [employer] after getting a job offer.    FAMILY HISTORY: Family History  Problem Relation Age of Onset  . Heart disease Father   . Heart disease Brother   . Diabetes Brother   . Heart disease Brother   . Colon cancer Father     ALLERGIES:  has No Known Allergies.  MEDICATIONS:  Current Outpatient Prescriptions  Medication Sig Dispense Refill  . bisoprolol-hydrochlorothiazide (ZIAC) 5-6.25 MG per tablet Take 1 tablet by mouth daily.      Marland Kitchen  carisoprodol (SOMA) 350 MG tablet Take 350 mg by mouth 2 (two) times daily.      Marland Kitchen HYDROcodone-acetaminophen (NORCO) 10-325 MG per tablet Take 1 tablet by mouth every 4 (four) hours as needed.      . Rivaroxaban (XARELTO) 20 MG TABS tablet Take 1 tablet (20 mg total) by mouth daily with supper. Please start this medicine on Dec 24th (after completing the 21 days of twice daily dosing)  90 tablet  0   No current  facility-administered medications for this visit.    REVIEW OF SYSTEMS:   Constitutional: Denies fevers, chills or abnormal night sweats Eyes: Denies blurriness of vision, double vision or watery eyes Ears, nose, mouth, throat, and face: Denies mucositis or sore throat Respiratory: Denies cough, dyspnea or wheezes Cardiovascular: Denies palpitation, chest discomfort or lower extremity swelling Gastrointestinal:  Denies nausea, heartburn or change in bowel habits Skin: Denies abnormal skin rashes Lymphatics: Denies new lymphadenopathy or easy bruising Neurological:Denies numbness, tingling or new weaknesses Behavioral/Psych: Mood is stable, no new changes  All other systems were reviewed with the patient and are negative.  PHYSICAL EXAMINATION: ECOG PERFORMANCE STATUS: 0 - Asymptomatic  Filed Vitals:   03/08/13 1036  BP: 137/97  Pulse: 94  Temp: 98.4 F (36.9 C)  Resp: 20   Filed Weights   03/08/13 1036  Weight: 213 lb 3.2 oz (96.707 kg)    GENERAL:alert, no distress and comfortable SKIN: skin color, texture, turgor are normal, no rashes or significant lesions EYES: normal, conjunctiva are pink and non-injected, sclera clear OROPHARYNX:no exudate, no erythema and lips, buccal mucosa, and tongue normal  NECK: supple, thyroid normal size, non-tender, without nodularity LYMPH:  no palpable lymphadenopathy in the cervical, axillary or inguinal LUNGS: clear to auscultation and percussion with normal breathing effort HEART: regular rate & rhythm and no murmurs and no lower extremity edema ABDOMEN:abdomen soft, non-tender and normal bowel sounds Musculoskeletal:no cyanosis of digits and no clubbing  PSYCH: alert & oriented x 3 with fluent speech NEURO: no focal motor/sensory deficits  LABORATORY DATA:  I have reviewed the data as listed Lab Results  Component Value Date   WBC 10.5 01/11/2013   HGB 12.4* 01/11/2013   HCT 37.9* 01/11/2013   MCV 95.7 01/11/2013   PLT 192  01/11/2013    His last blood work in December 2004 showed very mild anemia  RADIOGRAPHIC STUDIES: CT scan of the chest show significant clot burden I have personally reviewed the radiological images as listed and agreed with the findings in the report.   ASSESSMENT:  Bilateral PE, provoked by immobilization after spine surgery, predisposed by chronic smoking  PLAN:  I reviewed with the patient about the plan for care for PE.  This last episode of blood clot appeared to be provoked. There is no indication for testing for thrombophilia disorder. his current anticoagulation therapy will interfere with some the tests and it is not possible to interpret the test results.  Taking him off the anticoagulation therapy to do the tests may precipitate another thrombotic event. I do not see a reason to order excessive testing to screen for thrombophilia disorder as it would not change our management.  The goal of anticoagulation therapy is for 6-12 months  We discussed about various options of anticoagulation therapies including warfarin, low molecular weight heparin such as Lovenox or newer agents such as Rivaroxaban. Some of the risks and benefits discussed including costs involved, the need for monitoring, risks of life-threatening bleeding/hospitalization, reversibility of each agent  in the event of bleeding or overdose, safety profile of each drug and taking into account other social issues such as ease of administration of medications, etc. Ultimately, we have made an informed decision for the patient to continue his treatment with Xarelto  Finally, at the end of our consultation today, I reinforced the importance of preventive strategies such as avoiding hormonal supplement, avoiding cigarette smoking, keeping up-to-date with screening programs for early cancer detection, frequent ambulation for long distance travel and aggressive DVT prophylaxis in all surgical settings.  The patient is motivated to  quit smoking. I plan to recheck a d-dimer and routine blood work to be done a week before his return visit in June. If the patient was able to successfully quit smoking and his d-dimer and other blood work came back within normal limits, we can consider discontinuation of anticoagulation therapy at that time.  Orders Placed This Encounter  Procedures  . CBC with Differential    Standing Status: Future     Number of Occurrences:      Standing Expiration Date: 03/08/2014  . Basic metabolic panel    Standing Status: Future     Number of Occurrences:      Standing Expiration Date: 03/08/2014  . D-dimer, quantitative    Standing Status: Future     Number of Occurrences:      Standing Expiration Date: 03/08/2014    All questions were answered. The patient knows to call the clinic with any problems, questions or concerns. I spent 40 minutes counseling the patient face to face. The total time spent in the appointment was 60 minutes and more than 50% was on counseling.     [facility] Hospital NorthGORSUCH, Fransisco Messmer, MD 03/08/2013 12:36 PM

## 2013-03-08 NOTE — Progress Notes (Signed)
Checked in new pt with no financial concerns. °

## 2013-03-08 NOTE — Telephone Encounter (Signed)
Mailed the pt his June 2015 appt calendar.

## 2013-03-30 ENCOUNTER — Telehealth: Payer: Self-pay | Admitting: *Deleted

## 2013-03-30 MED ORDER — RIVAROXABAN 20 MG PO TABS: 20.0000 mg | ORAL_TABLET | Freq: Every day | ORAL | Status: DC

## 2013-03-30 NOTE — Telephone Encounter (Signed)
Obtained 2 Xarelto 20 mg tablets for pt to make sure he doesn't run out before his Rx gets sent from [facility].  Informed his S.O. Of tablets available to pick up.  She verbalized understanding.

## 2013-03-30 NOTE — Telephone Encounter (Signed)
Pt's significant other called to request 90 day supply Xarelto be faxed to [facility].  States he has only 4 pills left and it will take at least 5 days for [facility]'s [facility].  Asks if we have any samples to give pt?   Our pharmacy will obtain a few pills for pt this afternoon.  Informed wife [facility] and I will call her back later today when we have a few Xarelto pills for pt to pick up.  She verbalized understanding and states grateful for our assistance.

## 2013-04-26 ENCOUNTER — Telehealth: Payer: Self-pay | Admitting: *Deleted

## 2013-04-26 ENCOUNTER — Encounter: Payer: Self-pay | Admitting: Hematology and Oncology

## 2013-04-26 NOTE — Telephone Encounter (Signed)
Pt left VM states [employer] is having difficulty processing his disability claim due to not receiving records from us.  He says they told him they are having a "hard time getting information."  He left their fax #[phone].   Forwarded this message to Axel Fillerbony Smith in our Managed Care Dept.  For follow up.

## 2013-04-26 NOTE — Progress Notes (Signed)
Faxed patient's office note to [employer] @ [phone]

## 2013-07-05 ENCOUNTER — Other Ambulatory Visit: Payer: Self-pay | Admitting: Hematology and Oncology

## 2013-07-07 ENCOUNTER — Telehealth: Payer: Self-pay | Admitting: *Deleted

## 2013-07-07 NOTE — Telephone Encounter (Signed)
Requested refill on Xarelto.  Called Kelly back and she reports the pharmacy told her they have got the refill sent by Dr. Bertis Ruddy.  No further needs at this time.

## 2013-07-12 ENCOUNTER — Other Ambulatory Visit (HOSPITAL_BASED_OUTPATIENT_CLINIC_OR_DEPARTMENT_OTHER): Payer: BC Managed Care – PPO

## 2013-07-12 DIAGNOSIS — I2699 Other pulmonary embolism without acute cor pulmonale: Secondary | ICD-10-CM

## 2013-07-12 LAB — CBC WITH DIFFERENTIAL/PLATELET
BASO%: 1.2 % (ref 0.0–2.0)
Basophils Absolute: 0.1 10*3/uL (ref 0.0–0.1)
EOS%: 4.6 % (ref 0.0–7.0)
Eosinophils Absolute: 0.3 10*3/uL (ref 0.0–0.5)
HCT: 40.8 % (ref 38.4–49.9)
HGB: 14.2 g/dL (ref 13.0–17.1)
LYMPH%: 27.5 % (ref 14.0–49.0)
MCH: 33.2 pg (ref 27.2–33.4)
MCHC: 34.7 g/dL (ref 32.0–36.0)
MCV: 95.7 fL (ref 79.3–98.0)
MONO#: 0.6 10*3/uL (ref 0.1–0.9)
MONO%: 9.9 % (ref 0.0–14.0)
NEUT#: 3.5 10*3/uL (ref 1.5–6.5)
NEUT%: 56.8 % (ref 39.0–75.0)
Platelets: 185 10*3/uL (ref 140–400)
RBC: 4.26 10*6/uL (ref 4.20–5.82)
RDW: 13.3 % (ref 11.0–14.6)
WBC: 6.1 10*3/uL (ref 4.0–10.3)
lymph#: 1.7 10*3/uL (ref 0.9–3.3)

## 2013-07-12 LAB — BASIC METABOLIC PANEL (CC13)
Anion Gap: 15 mEq/L — ABNORMAL HIGH (ref 3–11)
BUN: 12.8 mg/dL (ref 7.0–26.0)
CHLORIDE: 105 meq/L (ref 98–109)
CO2: 19 meq/L — AB (ref 22–29)
Calcium: 8.5 mg/dL (ref 8.4–10.4)
Creatinine: 0.8 mg/dL (ref 0.7–1.3)
Glucose: 143 mg/dl — ABNORMAL HIGH (ref 70–140)
Potassium: 3.4 mEq/L — ABNORMAL LOW (ref 3.5–5.1)
Sodium: 139 mEq/L (ref 136–145)

## 2013-07-12 LAB — D-DIMER, QUANTITATIVE (NOT AT ARMC): D DIMER QUANT: 0.42 ug{FEU}/mL (ref 0.00–0.48)

## 2013-07-19 ENCOUNTER — Ambulatory Visit (HOSPITAL_BASED_OUTPATIENT_CLINIC_OR_DEPARTMENT_OTHER): Payer: BC Managed Care – PPO | Admitting: Hematology and Oncology

## 2013-07-19 ENCOUNTER — Encounter: Payer: Self-pay | Admitting: Hematology and Oncology

## 2013-07-19 VITALS — BP 135/93 | HR 84 | Temp 97.8°F | Resp 20 | Ht 71.0 in | Wt 206.9 lb

## 2013-07-19 DIAGNOSIS — I2699 Other pulmonary embolism without acute cor pulmonale: Secondary | ICD-10-CM

## 2013-07-19 NOTE — Assessment & Plan Note (Signed)
His most recent d-dimer was negative. The patient has no symptoms. He has quit smoking. He is currently mobile and able to exercise. We had a long discussion about the risk and benefit of discontinuation of Xarelto and the timing of discontinuation. He has approximately 3 months worth of prescription left. We agreed for him to discontinue his anticoagulation therapy when his prescription runs out. We agreed there is no major benefit of repeating a CT angiogram scan. After he discontinues anticoagulation treatment, I recommend he takes 2 baby aspirin daily to prevent risk of reoccurrence of blood clot.

## 2013-07-19 NOTE — Progress Notes (Signed)
[facility] OFFICE PROGRESS NOTE  LITTLE,JAMES, MD DIAGNOSIS: Bilateral PE HISTORY OF PRESENTING ILLNESS:  Anthony BimlerJeffrey T Berg 48 y.o. male is here because of diagnosis of bilateral PE According to the patient, he had significant history of spinal spondylolisthesis as well as degenerative joint disease and his back. The patient underwent surgery in October 2014. He was hospitalized for 5 days and between October to December, has been quite immobile due to his surgery.  He subsequently woke up on 01/10/2013 with severe chest pain. His girlfriend check his oxygen saturation and noted that the patient have hypoxemic respiratory failure, low-grade fever and tachycardia. He was directed to the emergency department and had extensive evaluation. CT scan of the chest show bilateral pulmonary emboli with significant clot burden. The patient was placed on anticoagulation therapy and subsequently transitioned to oral anticoagulation therapy. The patient has chronic tobacco abuse with smoking. He had no prior history or diagnosis of cancer. His age appropriate screening programs are up-to-date. He had prior surgeries before and never had perioperative thromboembolic events. Specifically he had 4 episodes of knee surgeries The patient had never been exposed to testosterone replacement therapy. There is no family history of blood clots or miscarriages. INTERVAL HISTORY: Anthony Berg 48 y.o. male returns for return followup here He has quit smoking approximately a month ago. He is currently mobile and able to perform all activities of daily living. The patient denies any recent signs or symptoms of bleeding such as spontaneous epistaxis, hematuria or hematochezia.   I have reviewed the past medical history, past surgical history, social history and family history with the patient and they are unchanged from previous note.  ALLERGIES:  has No Known Allergies.  MEDICATIONS:  Current Outpatient  Prescriptions  Medication Sig Dispense Refill  . bisoprolol-hydrochlorothiazide (ZIAC) 5-6.25 MG per tablet Take 1 tablet by mouth daily.      . carisoprodol (SOMA) 350 MG tablet Take 350 mg by mouth 2 (two) times daily.      . diazepam (VALIUM) 5 MG tablet 0.5 mg.      . HYDROcodone-acetaminophen (NORCO) 10-325 MG per tablet Take 1 tablet by mouth every 4 (four) hours as needed.      Carlena Hurl. XARELTO 20 MG TABS tablet TAKE 1 TABLET DAILY WITH   SUPPER  90 tablet  0   No current facility-administered medications for this visit.     REVIEW OF SYSTEMS:   Constitutional: Denies fevers, chills or night sweats Eyes: Denies blurriness of vision Ears, nose, mouth, throat, and face: Denies mucositis or sore throat Respiratory: Denies cough, dyspnea or wheezes Cardiovascular: Denies palpitation, chest discomfort or lower extremity swelling Gastrointestinal:  Denies nausea, heartburn or change in bowel habits Skin: Denies abnormal skin rashes Lymphatics: Denies new lymphadenopathy or easy bruising Neurological:Denies numbness, tingling or new weaknesses Behavioral/Psych: Mood is stable, no new changes  All other systems were reviewed with the patient and are negative.  PHYSICAL EXAMINATION: ECOG PERFORMANCE STATUS: 0 - Asymptomatic  Filed Vitals:   07/19/13 1007  BP: 135/93  Pulse: 84  Temp: 97.8 F (36.6 C)  Resp: 20   Filed Weights   07/19/13 1007  Weight: 206 lb 14.4 oz (93.849 kg)    GENERAL:alert, no distress and comfortable SKIN: skin color, texture, turgor are normal, no rashes or significant lesions EYES: normal, Conjunctiva are pink and non-injected, sclera clear Musculoskeletal:no cyanosis of digits and no clubbing  NEURO: alert & oriented x 3 with fluent speech, no focal motor/sensory deficits  LABORATORY DATA:  I have reviewed the data as listed No results found for this or any previous visit (from the past 48 hour(s)).  Lab Results  Component Value Date   WBC 6.1  07/12/2013   HGB 14.2 07/12/2013   HCT 40.8 07/12/2013   MCV 95.7 07/12/2013   PLT 185 07/12/2013    ASSESSMENT & PLAN:  Bilateral pulmonary embolism His most recent d-dimer was negative. The patient has no symptoms. He has quit smoking. He is currently mobile and able to exercise. We had a long discussion about the risk and benefit of discontinuation of Xarelto and the timing of discontinuation. He has approximately 3 months worth of prescription left. We agreed for him to discontinue his anticoagulation therapy when his prescription runs out. We agreed there is no major benefit of repeating a CT angiogram scan. After he discontinues anticoagulation treatment, I recommend he takes 2 baby aspirin daily to prevent risk of reoccurrence of blood clot.    All questions were answered. The patient knows to call the clinic with any problems, questions or concerns. No barriers to learning was detected.  I spent 25 minutes counseling the patient face to face. The total time spent in the appointment was 30 minutes and more than 50% was on counseling.     Artis Delay, MD 07/19/2013 9:23 PM

## 2013-11-20 ENCOUNTER — Encounter (HOSPITAL_COMMUNITY): Payer: Self-pay | Admitting: Emergency Medicine

## 2013-11-20 ENCOUNTER — Inpatient Hospital Stay (HOSPITAL_COMMUNITY)
Admission: EM | Admit: 2013-11-20 | Discharge: 2013-11-23 | DRG: 493 | Disposition: A | Discharge status: Home / Self Care | Payer: BC Managed Care – PPO | Attending: Orthopaedic Surgery | Admitting: Orthopaedic Surgery

## 2013-11-20 ENCOUNTER — Emergency Department (HOSPITAL_COMMUNITY): Payer: BC Managed Care – PPO

## 2013-11-20 DIAGNOSIS — F329 Major depressive disorder, single episode, unspecified: Secondary | ICD-10-CM | POA: Diagnosis present

## 2013-11-20 DIAGNOSIS — T148XXA Other injury of unspecified body region, initial encounter: Secondary | ICD-10-CM

## 2013-11-20 DIAGNOSIS — I10 Essential (primary) hypertension: Secondary | ICD-10-CM | POA: Diagnosis present

## 2013-11-20 DIAGNOSIS — Z833 Family history of diabetes mellitus: Secondary | ICD-10-CM

## 2013-11-20 DIAGNOSIS — S82451A Displaced comminuted fracture of shaft of right fibula, initial encounter for closed fracture: Secondary | ICD-10-CM | POA: Diagnosis present

## 2013-11-20 DIAGNOSIS — D62 Acute posthemorrhagic anemia: Secondary | ICD-10-CM | POA: Diagnosis not present

## 2013-11-20 DIAGNOSIS — Z8249 Family history of ischemic heart disease and other diseases of the circulatory system: Secondary | ICD-10-CM

## 2013-11-20 DIAGNOSIS — Z86711 Personal history of pulmonary embolism: Secondary | ICD-10-CM

## 2013-11-20 DIAGNOSIS — S82401A Unspecified fracture of shaft of right fibula, initial encounter for closed fracture: Secondary | ICD-10-CM

## 2013-11-20 DIAGNOSIS — Z7901 Long term (current) use of anticoagulants: Secondary | ICD-10-CM | POA: Diagnosis not present

## 2013-11-20 DIAGNOSIS — S82251A Displaced comminuted fracture of shaft of right tibia, initial encounter for closed fracture: Secondary | ICD-10-CM | POA: Diagnosis present

## 2013-11-20 DIAGNOSIS — Z87891 Personal history of nicotine dependence: Secondary | ICD-10-CM

## 2013-11-20 DIAGNOSIS — S82409A Unspecified fracture of shaft of unspecified fibula, initial encounter for closed fracture: Secondary | ICD-10-CM

## 2013-11-20 DIAGNOSIS — Y92488 Other paved roadways as the place of occurrence of the external cause: Secondary | ICD-10-CM

## 2013-11-20 DIAGNOSIS — F419 Anxiety disorder, unspecified: Secondary | ICD-10-CM | POA: Diagnosis present

## 2013-11-20 DIAGNOSIS — S82209A Unspecified fracture of shaft of unspecified tibia, initial encounter for closed fracture: Secondary | ICD-10-CM

## 2013-11-20 DIAGNOSIS — Z79899 Other long term (current) drug therapy: Secondary | ICD-10-CM | POA: Diagnosis not present

## 2013-11-20 DIAGNOSIS — M79604 Pain in right leg: Secondary | ICD-10-CM | POA: Diagnosis present

## 2013-11-20 DIAGNOSIS — S82201A Unspecified fracture of shaft of right tibia, initial encounter for closed fracture: Secondary | ICD-10-CM

## 2013-11-20 HISTORY — DX: Personal history of pulmonary embolism: Z86.711

## 2013-11-20 LAB — CBC WITH DIFFERENTIAL/PLATELET
Basophils Absolute: 0.1 10*3/uL (ref 0.0–0.1)
Basophils Relative: 1 % (ref 0–1)
EOS PCT: 1 % (ref 0–5)
Eosinophils Absolute: 0.1 10*3/uL (ref 0.0–0.7)
HEMATOCRIT: 40.1 % (ref 39.0–52.0)
HEMOGLOBIN: 13.7 g/dL (ref 13.0–17.0)
LYMPHS ABS: 1.8 10*3/uL (ref 0.7–4.0)
LYMPHS PCT: 20 % (ref 12–46)
MCH: 33 pg (ref 26.0–34.0)
MCHC: 34.2 g/dL (ref 30.0–36.0)
MCV: 96.6 fL (ref 78.0–100.0)
Monocytes Absolute: 0.6 10*3/uL (ref 0.1–1.0)
Monocytes Relative: 7 % (ref 3–12)
Neutro Abs: 6.6 10*3/uL (ref 1.7–7.7)
Neutrophils Relative %: 71 % (ref 43–77)
Platelets: 174 10*3/uL (ref 150–400)
RBC: 4.15 MIL/uL — AB (ref 4.22–5.81)
RDW: 12.8 % (ref 11.5–15.5)
WBC: 9.2 10*3/uL (ref 4.0–10.5)

## 2013-11-20 LAB — BASIC METABOLIC PANEL
Anion gap: 16 — ABNORMAL HIGH (ref 5–15)
BUN: 12 mg/dL (ref 6–23)
CO2: 24 meq/L (ref 19–32)
CREATININE: 0.67 mg/dL (ref 0.50–1.35)
Calcium: 9.1 mg/dL (ref 8.4–10.5)
Chloride: 95 mEq/L — ABNORMAL LOW (ref 96–112)
GFR calc Af Amer: >90 mL/min (ref >90)
GFR calc non Af Amer: >90 mL/min (ref >90)
GLUCOSE: 110 mg/dL — AB (ref 70–99)
Potassium: 3.5 mEq/L — ABNORMAL LOW (ref 3.7–5.3)
Sodium: 135 mEq/L — ABNORMAL LOW (ref 137–147)

## 2013-11-20 LAB — PROTIME-INR
INR: 0.94 (ref 0.00–1.49)
Prothrombin Time: 12.6 seconds (ref 11.6–15.2)

## 2013-11-20 LAB — APTT: APTT: 36 s (ref 24–37)

## 2013-11-20 MED ORDER — HYDROMORPHONE HCL 1 MG/ML IJ SOLN
1.0000 mg | INTRAMUSCULAR | Status: DC | PRN
  Administered 2013-11-20 – 2013-11-22 (×10): 1 mg via INTRAVENOUS
  Filled 2013-11-20 (×11): qty 1

## 2013-11-20 MED ORDER — METHOCARBAMOL 1000 MG/10ML IJ SOLN: 500.0000 mg | Freq: Four times a day (QID) | INTRAVENOUS | Status: DC | PRN

## 2013-11-20 MED ORDER — FENTANYL CITRATE 0.05 MG/ML IJ SOLN
INTRAMUSCULAR | Status: AC
  Filled 2013-11-20: qty 2

## 2013-11-20 MED ORDER — METOCLOPRAMIDE HCL 10 MG PO TABS: 5.0000 mg | ORAL_TABLET | Freq: Three times a day (TID) | ORAL | Status: DC | PRN

## 2013-11-20 MED ORDER — DOCUSATE SODIUM 100 MG PO CAPS
100.0000 mg | ORAL_CAPSULE | Freq: Two times a day (BID) | ORAL | Status: DC
  Administered 2013-11-20 – 2013-11-23 (×6): 100 mg via ORAL
  Filled 2013-11-20 (×7): qty 1

## 2013-11-20 MED ORDER — ONDANSETRON HCL 4 MG/2ML IJ SOLN: 4.0000 mg | Freq: Four times a day (QID) | INTRAMUSCULAR | Status: DC | PRN

## 2013-11-20 MED ORDER — SODIUM CHLORIDE 0.9 % IV SOLN: INTRAVENOUS | Status: DC

## 2013-11-20 MED ORDER — FENTANYL CITRATE 0.05 MG/ML IJ SOLN
100.0000 ug | Freq: Once | INTRAMUSCULAR | Status: AC
  Administered 2013-11-20: 100 ug via INTRAVENOUS
  Filled 2013-11-20: qty 2

## 2013-11-20 MED ORDER — ONDANSETRON HCL 4 MG PO TABS: 4.0000 mg | ORAL_TABLET | Freq: Four times a day (QID) | ORAL | Status: DC | PRN

## 2013-11-20 MED ORDER — BISOPROLOL-HYDROCHLOROTHIAZIDE 5-6.25 MG PO TABS
1.0000 | ORAL_TABLET | Freq: Every day | ORAL | Status: DC
  Administered 2013-11-21 – 2013-11-23 (×3): 1 via ORAL
  Filled 2013-11-20 (×3): qty 1

## 2013-11-20 MED ORDER — OXYCODONE HCL 5 MG PO TABS
5.0000 mg | ORAL_TABLET | ORAL | Status: DC | PRN
  Administered 2013-11-20 – 2013-11-22 (×6): 15 mg via ORAL
  Administered 2013-11-22 (×2): 10 mg via ORAL
  Administered 2013-11-22 (×2): 15 mg via ORAL
  Administered 2013-11-22: 10 mg via ORAL
  Administered 2013-11-23 (×2): 15 mg via ORAL
  Filled 2013-11-20: qty 2
  Filled 2013-11-20 (×4): qty 3
  Filled 2013-11-20: qty 2
  Filled 2013-11-20 (×4): qty 3
  Filled 2013-11-20: qty 2
  Filled 2013-11-20 (×3): qty 3

## 2013-11-20 MED ORDER — FENTANYL CITRATE 0.05 MG/ML IJ SOLN
100.0000 ug | Freq: Once | INTRAMUSCULAR | Status: AC
  Administered 2013-11-20: 100 ug via INTRAVENOUS

## 2013-11-20 MED ORDER — METOCLOPRAMIDE HCL 5 MG/ML IJ SOLN: 5.0000 mg | Freq: Three times a day (TID) | INTRAMUSCULAR | Status: DC | PRN

## 2013-11-20 MED ORDER — ZOLPIDEM TARTRATE 5 MG PO TABS
5.0000 mg | ORAL_TABLET | Freq: Every evening | ORAL | Status: DC | PRN
  Administered 2013-11-21 – 2013-11-22 (×2): 5 mg via ORAL
  Filled 2013-11-20 (×2): qty 1

## 2013-11-20 MED ORDER — METHOCARBAMOL 500 MG PO TABS
500.0000 mg | ORAL_TABLET | Freq: Four times a day (QID) | ORAL | Status: DC | PRN
  Administered 2013-11-20 – 2013-11-21 (×2): 500 mg via ORAL
  Filled 2013-11-20 (×2): qty 1

## 2013-11-20 MED ORDER — POLYETHYLENE GLYCOL 3350 17 G PO PACK: 17.0000 g | PACK | Freq: Every day | ORAL | Status: DC | PRN

## 2013-11-20 MED ORDER — DIPHENHYDRAMINE HCL 12.5 MG/5ML PO ELIX
12.5000 mg | ORAL_SOLUTION | ORAL | Status: DC | PRN
  Administered 2013-11-20: 25 mg via ORAL
  Filled 2013-11-20: qty 10
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
## 2013-11-20 NOTE — Progress Notes (Signed)
Orthopedic Tech Progress Note Patient Details:  Anthony BimlerJeffrey T Berg February 13, 1965 [id number]  Ortho Devices Type of Ortho Device: Ace wrap;Post (short leg) splint Ortho Device/Splint Location: rle Ortho Device/Splint Interventions: Application As ordered by Dr. Daisey MustJonah Gunalda  [name] 11/20/2013, 5:58 PM
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
## 2013-11-20 NOTE — ED Notes (Signed)
Pt returned from radiology.

## 2013-11-20 NOTE — ED Notes (Signed)
Ortho at bedside.
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
## 2013-11-21 ENCOUNTER — Inpatient Hospital Stay (HOSPITAL_COMMUNITY): Payer: BC Managed Care – PPO | Admitting: Anesthesiology

## 2013-11-21 ENCOUNTER — Encounter (HOSPITAL_COMMUNITY)
Admission: EM | Disposition: A | Discharge status: Home / Self Care | Payer: Self-pay | Source: Home / Self Care | Attending: Orthopaedic Surgery

## 2013-11-21 ENCOUNTER — Inpatient Hospital Stay (HOSPITAL_COMMUNITY): Payer: BC Managed Care – PPO

## 2013-11-21 ENCOUNTER — Encounter (HOSPITAL_COMMUNITY): Payer: Self-pay | Admitting: Anesthesiology

## 2013-11-21 ENCOUNTER — Encounter (HOSPITAL_COMMUNITY): Payer: BC Managed Care – PPO | Admitting: Anesthesiology

## 2013-11-21 HISTORY — PX: FRACTURE SURGERY: SHX138

## 2013-11-21 HISTORY — PX: TIBIA IM NAIL INSERTION: SHX2516

## 2013-11-21 LAB — SURGICAL PCR SCREEN
MRSA, PCR: NEGATIVE
STAPHYLOCOCCUS AUREUS: NEGATIVE

## 2013-11-21 SURGERY — INSERTION, INTRAMEDULLARY ROD, TIBIA
Anesthesia: General | Site: Leg Lower | Laterality: Right

## 2013-11-21 MED ORDER — SODIUM CHLORIDE 0.9 % IV SOLN
INTRAVENOUS | Status: DC
  Administered 2013-11-21: 19:00:00 via INTRAVENOUS

## 2013-11-21 MED ORDER — LACTATED RINGERS IV SOLN
INTRAVENOUS | Status: DC | PRN
  Administered 2013-11-21 (×2): via INTRAVENOUS

## 2013-11-21 MED ORDER — ONDANSETRON HCL 4 MG PO TABS: 4.0000 mg | ORAL_TABLET | Freq: Four times a day (QID) | ORAL | Status: DC | PRN

## 2013-11-21 MED ORDER — DIPHENHYDRAMINE HCL 12.5 MG/5ML PO ELIX
25.0000 mg | ORAL_SOLUTION | ORAL | Status: DC | PRN
Start: 2013-11-21 — End: 2013-11-21

## 2013-11-21 MED ORDER — PROMETHAZINE HCL 25 MG/ML IJ SOLN: 6.2500 mg | INTRAMUSCULAR | Status: DC | PRN

## 2013-11-21 MED ORDER — METHOCARBAMOL 1000 MG/10ML IJ SOLN
500.0000 mg | Freq: Four times a day (QID) | INTRAVENOUS | Status: DC | PRN
  Filled 2013-11-21: qty 5

## 2013-11-21 MED ORDER — DEXAMETHASONE SODIUM PHOSPHATE 4 MG/ML IJ SOLN
INTRAMUSCULAR | Status: DC | PRN
  Administered 2013-11-21: 4 mg via INTRAVENOUS

## 2013-11-21 MED ORDER — SENNA 8.6 MG PO TABS
1.0000 | ORAL_TABLET | Freq: Two times a day (BID) | ORAL | Status: DC
  Administered 2013-11-21 – 2013-11-23 (×4): 8.6 mg via ORAL
  Filled 2013-11-21 (×5): qty 1

## 2013-11-21 MED ORDER — MORPHINE SULFATE 2 MG/ML IJ SOLN: 1.0000 mg | INTRAMUSCULAR | Status: DC | PRN

## 2013-11-21 MED ORDER — 0.9 % SODIUM CHLORIDE (POUR BTL) OPTIME
TOPICAL | Status: DC | PRN
  Administered 2013-11-21: 1000 mL

## 2013-11-21 MED ORDER — MAGNESIUM CITRATE PO SOLN
1.0000 | Freq: Once | ORAL | Status: AC | PRN
Start: 2013-11-21 — End: 2013-11-21

## 2013-11-21 MED ORDER — HYDROMORPHONE HCL 1 MG/ML IJ SOLN
INTRAMUSCULAR | Status: AC
  Filled 2013-11-21: qty 1

## 2013-11-21 MED ORDER — POLYETHYLENE GLYCOL 3350 17 G PO PACK: 17.0000 g | PACK | Freq: Every day | ORAL | Status: DC | PRN

## 2013-11-21 MED ORDER — FENTANYL CITRATE 0.05 MG/ML IJ SOLN
INTRAMUSCULAR | Status: AC
  Filled 2013-11-21: qty 5

## 2013-11-21 MED ORDER — OXYCODONE HCL 5 MG PO TABS: 5.0000 mg | ORAL_TABLET | ORAL | Status: DC | PRN

## 2013-11-21 MED ORDER — METOCLOPRAMIDE HCL 5 MG/ML IJ SOLN: 5.0000 mg | Freq: Three times a day (TID) | INTRAMUSCULAR | Status: DC | PRN

## 2013-11-21 MED ORDER — MIDAZOLAM HCL 5 MG/5ML IJ SOLN
INTRAMUSCULAR | Status: DC | PRN
  Administered 2013-11-21: 2 mg via INTRAVENOUS

## 2013-11-21 MED ORDER — SORBITOL 70 % SOLN: 30.0000 mL | Freq: Every day | Status: DC | PRN

## 2013-11-21 MED ORDER — PROPOFOL 10 MG/ML IV BOLUS
INTRAVENOUS | Status: AC
  Filled 2013-11-21: qty 20

## 2013-11-21 MED ORDER — CEFAZOLIN SODIUM-DEXTROSE 2-3 GM-% IV SOLR
INTRAVENOUS | Status: AC
  Filled 2013-11-21: qty 50

## 2013-11-21 MED ORDER — OXYCODONE HCL 5 MG PO TABS: 5.0000 mg | ORAL_TABLET | ORAL | Status: AC | PRN

## 2013-11-21 MED ORDER — CEFAZOLIN SODIUM-DEXTROSE 2-3 GM-% IV SOLR
2.0000 g | Freq: Three times a day (TID) | INTRAVENOUS | Status: DC
  Administered 2013-11-21: 2 g via INTRAVENOUS

## 2013-11-21 MED ORDER — CEFAZOLIN SODIUM-DEXTROSE 2-3 GM-% IV SOLR
2.0000 g | Freq: Four times a day (QID) | INTRAVENOUS | Status: AC
  Administered 2013-11-21 – 2013-11-22 (×3): 2 g via INTRAVENOUS
  Filled 2013-11-21 (×3): qty 50

## 2013-11-21 MED ORDER — HYDROCODONE-ACETAMINOPHEN 5-325 MG PO TABS
1.0000 | ORAL_TABLET | ORAL | Status: DC | PRN
  Administered 2013-11-22: 2 via ORAL
  Filled 2013-11-21: qty 2

## 2013-11-21 MED ORDER — ENOXAPARIN SODIUM 40 MG/0.4ML ~~LOC~~ SOLN: 40.0000 mg | Freq: Every day | SUBCUTANEOUS | Status: AC

## 2013-11-21 MED ORDER — PROPOFOL 10 MG/ML IV BOLUS
INTRAVENOUS | Status: DC | PRN
  Administered 2013-11-21: 200 mg via INTRAVENOUS

## 2013-11-21 MED ORDER — MIDAZOLAM HCL 2 MG/2ML IJ SOLN
INTRAMUSCULAR | Status: AC
  Filled 2013-11-21: qty 2

## 2013-11-21 MED ORDER — FENTANYL CITRATE 0.05 MG/ML IJ SOLN
INTRAMUSCULAR | Status: DC | PRN
  Administered 2013-11-21 (×6): 50 ug via INTRAVENOUS

## 2013-11-21 MED ORDER — OXYCODONE HCL 5 MG/5ML PO SOLN: 5.0000 mg | Freq: Once | ORAL | Status: DC | PRN

## 2013-11-21 MED ORDER — DEXAMETHASONE SODIUM PHOSPHATE 4 MG/ML IJ SOLN
INTRAMUSCULAR | Status: AC
  Filled 2013-11-21: qty 1

## 2013-11-21 MED ORDER — METHOCARBAMOL 500 MG PO TABS
500.0000 mg | ORAL_TABLET | Freq: Four times a day (QID) | ORAL | Status: DC | PRN
  Administered 2013-11-21 – 2013-11-23 (×4): 500 mg via ORAL
  Filled 2013-11-21 (×5): qty 1

## 2013-11-21 MED ORDER — LIDOCAINE HCL (CARDIAC) 20 MG/ML IV SOLN
INTRAVENOUS | Status: DC | PRN
  Administered 2013-11-21: 60 mg via INTRAVENOUS

## 2013-11-21 MED ORDER — ONDANSETRON HCL 4 MG/2ML IJ SOLN
4.0000 mg | Freq: Four times a day (QID) | INTRAMUSCULAR | Status: DC | PRN
Start: 2013-11-21 — End: 2013-11-21

## 2013-11-21 MED ORDER — ENOXAPARIN SODIUM 40 MG/0.4ML ~~LOC~~ SOLN
40.0000 mg | SUBCUTANEOUS | Status: DC
  Administered 2013-11-22 – 2013-11-23 (×2): 40 mg via SUBCUTANEOUS
  Filled 2013-11-21 (×3): qty 0.4

## 2013-11-21 MED ORDER — METOCLOPRAMIDE HCL 10 MG PO TABS: 5.0000 mg | ORAL_TABLET | Freq: Three times a day (TID) | ORAL | Status: DC | PRN

## 2013-11-21 MED ORDER — HYDROMORPHONE HCL 1 MG/ML IJ SOLN
0.2500 mg | INTRAMUSCULAR | Status: DC | PRN
  Administered 2013-11-21 (×2): 0.25 mg via INTRAVENOUS

## 2013-11-21 MED ORDER — ONDANSETRON HCL 4 MG/2ML IJ SOLN
INTRAMUSCULAR | Status: AC
  Filled 2013-11-21: qty 2

## 2013-11-21 MED ORDER — ONDANSETRON HCL 4 MG/2ML IJ SOLN
INTRAMUSCULAR | Status: DC | PRN
  Administered 2013-11-21: 4 mg via INTRAVENOUS

## 2013-11-21 MED ORDER — OXYCODONE HCL 5 MG PO TABS: 5.0000 mg | ORAL_TABLET | Freq: Once | ORAL | Status: DC | PRN

## 2013-11-21 SURGICAL SUPPLY — 50 items
BANDAGE ELASTIC 4 VELCRO ST LF (GAUZE/BANDAGES/DRESSINGS) IMPLANT
BANDAGE ELASTIC 6 VELCRO ST LF (GAUZE/BANDAGES/DRESSINGS) ×3 IMPLANT
BANDAGE ESMARK 6X9 LF (GAUZE/BANDAGES/DRESSINGS) ×1 IMPLANT
BIT DRILL LONG 4.0 (BIT) ×1 IMPLANT
BIT DRILL SHORT 4.0 (BIT) ×2 IMPLANT
BNDG ESMARK 6X9 LF (GAUZE/BANDAGES/DRESSINGS) ×3
COVER MAYO STAND STRL (DRAPES) ×3 IMPLANT
COVER SURGICAL LIGHT HANDLE (MISCELLANEOUS) ×3 IMPLANT
CUFF TOURNIQUET SINGLE 34IN LL (TOURNIQUET CUFF) ×3 IMPLANT
DRAPE C-ARM 42X72 X-RAY (DRAPES) ×3 IMPLANT
DRAPE C-ARMOR (DRAPES) ×3 IMPLANT
DRAPE EXTREMITY T 121X128X90 (DRAPE) ×3 IMPLANT
DRAPE POUCH INSTRU U-SHP 10X18 (DRAPES) ×3 IMPLANT
DRAPE U-SHAPE 47X51 STRL (DRAPES) ×3 IMPLANT
DRAPE UTILITY XL STRL (DRAPES) ×6 IMPLANT
DRILL BIT LONG 4.0 (BIT) ×3
DRILL BIT SHORT 4.0 (BIT) ×4
DRSG PAD ABDOMINAL 8X10 ST (GAUZE/BANDAGES/DRESSINGS) ×6 IMPLANT
DURAPREP 26ML APPLICATOR (WOUND CARE) ×3 IMPLANT
ELECT CAUTERY BLADE 6.4 (BLADE) ×3 IMPLANT
ELECT REM PT RETURN 9FT ADLT (ELECTROSURGICAL) ×3
ELECTRODE REM PT RTRN 9FT ADLT (ELECTROSURGICAL) ×1 IMPLANT
FACESHIELD WRAPAROUND (MASK) ×3 IMPLANT
GAUZE SPONGE 4X4 12PLY STRL (GAUZE/BANDAGES/DRESSINGS) ×6 IMPLANT
GAUZE XEROFORM 1X8 LF (GAUZE/BANDAGES/DRESSINGS) IMPLANT
GAUZE XEROFORM 5X9 LF (GAUZE/BANDAGES/DRESSINGS) ×3 IMPLANT
GLOVE SURG SYN 7.5  E (GLOVE) ×4
GLOVE SURG SYN 7.5 E (GLOVE) ×2 IMPLANT
GOWN STRL REIN XL XLG (GOWN DISPOSABLE) ×3 IMPLANT
GUIDE PIN 3.2MM (MISCELLANEOUS) ×2
GUIDE PIN ORTH 343X3.2XBRAD (MISCELLANEOUS) ×1 IMPLANT
GUIDE ROD 3.0 (MISCELLANEOUS) ×3
KIT BASIN OR (CUSTOM PROCEDURE TRAY) ×3 IMPLANT
MANIFOLD NEPTUNE II (INSTRUMENTS) ×3 IMPLANT
NAIL TIBIAL 10X37 (Nail) ×3 IMPLANT
NS IRRIG 1000ML POUR BTL (IV SOLUTION) ×3 IMPLANT
PACK TOTAL JOINT (CUSTOM PROCEDURE TRAY) ×3 IMPLANT
PAD CAST 4YDX4 CTTN HI CHSV (CAST SUPPLIES) IMPLANT
PADDING CAST COTTON 4X4 STRL (CAST SUPPLIES)
ROD GUIDE 3.0 (MISCELLANEOUS) ×1 IMPLANT
SCREW TRIGEN LOW PROF 5.0X27.5 (Screw) ×3 IMPLANT
SCREW TRIGEN LOW PROF 5.0X35 (Screw) ×3 IMPLANT
SCREW TRIGEN LOW PROF 5.0X40 (Screw) ×3 IMPLANT
STAPLER SKIN PROX WIDE 3.9 (STAPLE) ×3 IMPLANT
SUT VIC AB 0 CT1 27 (SUTURE) ×4
SUT VIC AB 0 CT1 27XBRD ANTBC (SUTURE) ×2 IMPLANT
SUT VIC AB 2-0 CT1 27 (SUTURE) ×4
SUT VIC AB 2-0 CT1 TAPERPNT 27 (SUTURE) ×2 IMPLANT
TOWEL OR 17X26 10 PK STRL BLUE (TOWEL DISPOSABLE) ×6 IMPLANT
WATER STERILE IRR 1000ML POUR (IV SOLUTION) IMPLANT

## 2013-11-21 NOTE — Anesthesia Preprocedure Evaluation (Addendum)
Anesthesia Evaluation  Patient identified by MRN, date of birth, ID band Patient awake    Reviewed: Allergy & Precautions, H&P , NPO status , Patient's Chart, lab work & pertinent test results, reviewed documented beta blocker date and time   History of Anesthesia Complications Negative for: history of anesthetic complications  Airway Mallampati: III TM Distance: >3 FB     Dental  (+) Teeth Intact, Dental Advisory Given   Pulmonary former smoker,          Cardiovascular hypertension, Pt. on medications and Pt. on home beta blockers     Neuro/Psych negative neurological ROS     GI/Hepatic negative GI ROS, Neg liver ROS,   Endo/Other  negative endocrine ROS  Renal/GU negative Renal ROS     Musculoskeletal negative musculoskeletal ROS (+)   Abdominal   Peds  Hematology   Anesthesia Other Findings   Reproductive/Obstetrics negative OB ROS                          Anesthesia Physical Anesthesia Plan  ASA: II  Anesthesia Plan: General   Post-op Pain Management:    Induction: Intravenous  Airway Management Planned: Oral ETT and LMA  Additional Equipment:   Intra-op Plan:   Post-operative Plan: Extubation in OR  Informed Consent: I have reviewed the patients History and Physical, chart, labs and discussed the procedure including the risks, benefits and alternatives for the proposed anesthesia with the patient or authorized representative who has indicated his/her understanding and acceptance.   Dental advisory given  Plan Discussed with: CRNA and Surgeon  Anesthesia Plan Comments:         Anesthesia Quick Evaluation

## 2013-11-21 NOTE — Transfer of Care (Signed)
Immediate Anesthesia Transfer of Care Note  Patient: Anthony BimlerJeffrey T Gwynne  Procedure(s) Performed: Procedure(s): INTRAMEDULLARY (IM) NAIL TIBIAL (Right)  Patient Location: PACU  Anesthesia Type:General  Level of Consciousness: awake, alert , oriented and patient cooperative  Airway & Oxygen Therapy: Patient Spontanous Breathing and Patient connected to nasal cannula oxygen  Post-op Assessment: Report given to PACU RN and Post -op Vital signs reviewed and stable  Post vital signs: Reviewed and stable  Complications: No apparent anesthesia complications

## 2013-11-21 NOTE — Anesthesia Postprocedure Evaluation (Signed)
  Anesthesia Post-op Note  Patient: Anthony Berg  Procedure(s) Performed: Procedure(s): INTRAMEDULLARY (IM) NAIL TIBIAL (Right)  Patient Location: PACU  Anesthesia Type:General  Level of Consciousness: awake, alert  and oriented  Airway and Oxygen Therapy: Patient Spontanous Breathing  Post-op Pain: none  Post-op Assessment: Post-op Vital signs reviewed  Post-op Vital Signs: Reviewed  Last Vitals:  Filed Vitals:   11/21/13 1800  BP: 126/83  Pulse: 83  Temp: 36.7 C  Resp: 11    Complications: No apparent anesthesia complications

## 2013-11-21 NOTE — Progress Notes (Deleted)
Patient going directly from CT scan to OR.
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
## 2013-11-21 NOTE — Progress Notes (Signed)
I have evaluated the patient and discussed with him that I will be taking over his care and doing the surgery this afternoon.  Patient is in agreement.    Anthony ReelN. Michael Xu, MD [facility] [phone] 8:02 AM

## 2013-11-21 NOTE — Progress Notes (Signed)
Orthopedic Tech Progress Note Patient Details:  Anthony BimlerJeffrey T Berg [birth year] [id number]  Ortho Devices Type of Ortho Device: Ace wrap;Post (short leg) splint Ortho Device/Splint Location: applied ohf to bed Ortho Device/Splint Interventions: Ordered   Jennye MoccasinHughes, Kaitelyn Jamison Craig 11/21/2013, 8:03 PM

## 2013-11-21 NOTE — Anesthesia Procedure Notes (Signed)
Procedure Name: LMA Insertion Date/Time: 11/21/2013 3:37 PM Performed by: Romie MinusOCK, [place] Troia K Pre-anesthesia Checklist: Patient identified, Emergency Drugs available, Suction available, Patient being monitored and Timeout performed Patient Re-evaluated:Patient Re-evaluated prior to inductionOxygen Delivery Method: Circle system utilized Preoxygenation: Pre-oxygenation with 100% oxygen Intubation Type: IV induction LMA: LMA inserted LMA Size: 5.0 Number of attempts: 1 Placement Confirmation: positive ETCO2,  CO2 detector and breath sounds checked- equal and bilateral Tube secured with: Tape Dental Injury: Teeth and Oropharynx as per pre-operative assessment  Comments: Soft bite block utilized.

## 2013-11-21 NOTE — H&P (Signed)
H&P update  The surgical history has been reviewed and remains accurate without interval change.  The patient was re-examined and patient's physiologic condition has not changed significantly in the last 30 days. The condition still exists that makes this procedure necessary. The treatment plan remains the same, without new options for care.  No new pharmacological allergies or types of therapy has been initiated that would change the plan or the appropriateness of the plan.  The patient and/or family understand the potential benefits and risks.  Anthony ReelN. Michael Brinleigh Tew, MD 11/21/2013 7:07 AM

## 2013-11-22 ENCOUNTER — Encounter (HOSPITAL_COMMUNITY): Payer: Self-pay | Admitting: General Practice

## 2013-11-22 LAB — COMPREHENSIVE METABOLIC PANEL
ALBUMIN: 3 g/dL — AB (ref 3.5–5.2)
ALT: 17 U/L (ref 0–53)
AST: 24 U/L (ref 0–37)
Alkaline Phosphatase: 57 U/L (ref 39–117)
Anion gap: 9 (ref 5–15)
BUN: 16 mg/dL (ref 6–23)
CHLORIDE: 100 meq/L (ref 96–112)
CO2: 30 mEq/L (ref 19–32)
Calcium: 8.3 mg/dL — ABNORMAL LOW (ref 8.4–10.5)
Creatinine, Ser: 0.88 mg/dL (ref 0.50–1.35)
GFR calc Af Amer: >90 mL/min (ref >90)
GFR calc non Af Amer: >90 mL/min (ref >90)
Glucose, Bld: 134 mg/dL — ABNORMAL HIGH (ref 70–99)
POTASSIUM: 4.3 meq/L (ref 3.7–5.3)
Sodium: 139 mEq/L (ref 137–147)
Total Protein: 6.3 g/dL (ref 6.0–8.3)

## 2013-11-22 MED ORDER — KETOROLAC TROMETHAMINE 30 MG/ML IJ SOLN
30.0000 mg | Freq: Four times a day (QID) | INTRAMUSCULAR | Status: DC | PRN
  Administered 2013-11-22 – 2013-11-23 (×2): 30 mg via INTRAVENOUS
  Filled 2013-11-22 (×2): qty 1
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
## 2013-11-22 NOTE — Progress Notes (Signed)
Orthopedic Tech Progress Note Patient Details:  Anthony BimlerJeffrey T Berg [birth year] [id number] Cam walker boot applied by Wende NeighborsJennifer Cammer, OT [employer] Type of Ortho Device: Postop shoe/boot Ortho Device/Splint Location: RLE Ortho Device/Splint Interventions: Application   [name], [name] 11/22/2013, 1:47 PM
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
## 2013-11-22 NOTE — Progress Notes (Signed)
Lovenox 

## 2013-11-22 NOTE — Progress Notes (Signed)
   Subjective:  Patient reports pain as mild.    Objective:   VITALS:   Filed Vitals:   11/21/13 1800 11/21/13 2035 11/22/13 0154 11/22/13 0521  BP: 126/83 118/78 132/82 128/78  Pulse: 83 95 86 86  Temp: 98 F (36.7 C) 97.9 F (36.6 C) 98 F (36.7 C) 98.1 F (36.7 C)  TempSrc:      Resp: 11 16 16 16   Height:      Weight:      SpO2: 98% 98% 98% 98%    Neurologically intact Neurovascular intact Sensation intact distally Intact pulses distally Dorsiflexion/Plantar flexion intact Incision: dressing C/D/I and no drainage No cellulitis present Compartment soft   Lab Results  Component Value Date   WBC 9.2 11/20/2013   HGB 13.7 11/20/2013   HCT 40.1 11/20/2013   MCV 96.6 11/20/2013   PLT 174 11/20/2013     Assessment/Plan:  1 Day Post-Op   - Expected postop acute blood loss anemia - will monitor for symptoms - Up with PT/OT - DVT ppx - SCDs, ambulation, lovenox - NWB right lower extremity - Pain control - Discharge planning - possible dc home today  Cheral AlmasXu, Naiping Michael 11/22/2013, 7:53 AM [phone]
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
## 2013-11-22 NOTE — Discharge Instructions (Signed)
1. Strict non weight bearing 2. Take lovenox to prevent blood clots 3. Take pain meds as needed 4. Take stool softeners as needed

## 2013-11-22 NOTE — ED Provider Notes (Signed)
Please see my note. I saw and evaluated the patient, reviewed the resident's note and I agree with the findings and plan.    Hilario Quarryanielle S Abrial Arrighi, MD 11/22/13 [phone]

## 2013-11-23 ENCOUNTER — Encounter (HOSPITAL_COMMUNITY): Payer: Self-pay | Admitting: Orthopaedic Surgery

## 2013-11-23 NOTE — Progress Notes (Signed)
PT Cancellation Note  Patient Details Name: Anthony BimlerJeffrey T Berg MRN: [id number] DOB: 1965/09/01   Cancelled Treatment:    Reason Eval/Treat Not Completed: Patient declined, no reason specified. Pt stated "ive been up moving around all day, im all right". Reviewed NWB restrictions with pt. Pt with questions, all questions answered regarding mobility. Will sign off at this time. Thanks.    Donnamarie PoagWest, Maricella Filyaw MorrisN, [place]  [phone] 11/23/2013, 11:40 AM
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
## 2013-11-23 NOTE — Progress Notes (Signed)
Patient discharged to home with fiancee. Patient educated regarding medications and told to call Dr. Warren DanesXu's office for 2 week post op appointment. Patient verbalized understanding. IV d'cd. No S&S of distress.

## 2013-11-23 NOTE — Progress Notes (Signed)
Pain well controlled Compartments soft Dressings c/d/i Needs fx boot before dc Home with 2 weeks of lovenox Dc home today  N. Glee ArvinMichael Shantil Vallejo, MD [facility] [phone] 8:14 AM
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
## 2014-03-05 ENCOUNTER — Encounter (HOSPITAL_COMMUNITY): Payer: Self-pay | Admitting: *Deleted

## 2014-03-05 ENCOUNTER — Emergency Department (HOSPITAL_COMMUNITY)
Admission: EM | Admit: 2014-03-05 | Discharge: 2014-03-13 | Disposition: E | Payer: Self-pay | Attending: Emergency Medicine | Admitting: Emergency Medicine

## 2014-03-05 DIAGNOSIS — Z791 Long term (current) use of non-steroidal anti-inflammatories (NSAID): Secondary | ICD-10-CM | POA: Insufficient documentation

## 2014-03-05 DIAGNOSIS — Z79899 Other long term (current) drug therapy: Secondary | ICD-10-CM | POA: Insufficient documentation

## 2014-03-05 DIAGNOSIS — F419 Anxiety disorder, unspecified: Secondary | ICD-10-CM | POA: Insufficient documentation

## 2014-03-05 DIAGNOSIS — Z720 Tobacco use: Secondary | ICD-10-CM | POA: Insufficient documentation

## 2014-03-05 DIAGNOSIS — I10 Essential (primary) hypertension: Secondary | ICD-10-CM | POA: Insufficient documentation

## 2014-03-05 DIAGNOSIS — M545 Low back pain: Secondary | ICD-10-CM | POA: Insufficient documentation

## 2014-03-05 DIAGNOSIS — Z86711 Personal history of pulmonary embolism: Secondary | ICD-10-CM | POA: Insufficient documentation

## 2014-03-05 DIAGNOSIS — F329 Major depressive disorder, single episode, unspecified: Secondary | ICD-10-CM | POA: Insufficient documentation

## 2014-03-05 DIAGNOSIS — I469 Cardiac arrest, cause unspecified: Secondary | ICD-10-CM | POA: Insufficient documentation

## 2014-03-13 NOTE — Code Documentation (Addendum)
Patient time of death occurred at [id number].

## 2014-03-13 NOTE — Progress Notes (Signed)
   July 18, 2014 0500  Clinical Encounter Type  Visited With Family;Health care provider  Visit Type Initial;Death;ED  Spiritual Encounters  Spiritual Needs Grief support  Stress Factors  Family Stress Factors Loss   Chaplain was paged to the ED at 3:23 AM and notified that the patient was coming into the ED post-CPR. Patient's fiancee was with the patient and was having a difficult time. Chaplain escorted patient's fiancee to consultation room A. Physician came in shortly after and consulted with the patient's fiancee. Chaplain was present during this consultation. Through this consultation patient's fiancee was informed that the patient had passed away. A few family members and friends arrived to support the patient's fiancee. Chaplain attempted to support the patient's fiancee while she visited with the patient but the medical team indicated the patient could not be seen at all. Chaplain was later notified that the patient had already been transported to the morgue. Chaplain continued to provide support for the family. Chaplain spoke with patient's nurse and physician regarding why the family was not able to visit the patient, both referred to conversations with the Medical Examiner but no clear answer was provided. Patient's mother, son, and siblings were not present and may want to follow up with the hospital at a later time. Chaplain collected as much contact information for the patient's family as possible and gave this information to the patient's nurse. Patient's fiancee is being supported by family friends and were planning on returning home for the night. Page Merrilyn Puman-Call chaplain if further support needed. Cranston NeighborStrother, Tymier Lindholm R, Chaplain  5:51 AM

## 2014-03-13 NOTE — ED Notes (Signed)
Spoke with bed placement.  Al DecantRick Berg spoke with them and said that the patient was not to be viewed or used for donation.

## 2014-03-13 NOTE — ED Notes (Addendum)
EMS reports call came in regarding unresponsive patient  Upon their arrival to the house patient was unresponsive PEA rhythm CPR started at 0208  IO to left tibia, King airway placed, adm 15 Epi, amp D50, Narcan 4mg .  Patient received NS 2000ml (cold), 500ml room temp.  Upon arrival to the ED the Lucus was not in operation.

## 2014-03-13 NOTE — ED Notes (Addendum)
Patient taken to the morgue  No jewelry on patient

## 2014-03-13 NOTE — ED Notes (Signed)
[facility] spoke with Bradly BienenstockMelissa Davis  Ref [phone]#[id number]

## 2014-03-13 NOTE — ED Notes (Addendum)
Dr Mora Bellmanni used ultrasound machine - unable to detect any cardiac activity.
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
## 2014-03-13 DEATH — deceased
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
